# Patient Record
Sex: Female | Born: 2000 | Race: White | Hispanic: No | Marital: Single | Smoking: Never smoker
Health system: Southern US, Community
[De-identification: ages and names within clinical notes are randomized; demographics above are authoritative.]

## PROBLEM LIST (undated history)

## (undated) DIAGNOSIS — F329 Major depressive disorder, single episode, unspecified: Secondary | ICD-10-CM

## (undated) DIAGNOSIS — F419 Anxiety disorder, unspecified: Secondary | ICD-10-CM

## (undated) DIAGNOSIS — F32A Depression, unspecified: Secondary | ICD-10-CM

## (undated) DIAGNOSIS — G43909 Migraine, unspecified, not intractable, without status migrainosus: Secondary | ICD-10-CM

## (undated) HISTORY — DX: Major depressive disorder, single episode, unspecified: F32.9

## (undated) HISTORY — DX: Anxiety disorder, unspecified: F41.9

## (undated) HISTORY — DX: Depression, unspecified: F32.A

---
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
## 2019-01-05 ENCOUNTER — Ambulatory Visit: Payer: Medicaid Other | Attending: Family Medicine | Admitting: Physical Therapy

## 2019-01-05 DIAGNOSIS — M546 Pain in thoracic spine: Secondary | ICD-10-CM | POA: Insufficient documentation

## 2019-01-05 DIAGNOSIS — R293 Abnormal posture: Secondary | ICD-10-CM | POA: Insufficient documentation

## 2019-01-05 DIAGNOSIS — M62830 Muscle spasm of back: Secondary | ICD-10-CM | POA: Insufficient documentation

## 2019-01-06 ENCOUNTER — Telehealth: Payer: Self-pay | Admitting: Physical Therapy

## 2019-01-06 NOTE — Telephone Encounter (Signed)
Patient's phone not accepting voicemail. Left message on mother's phone regarding no-show to appointment yesterday and left next appointment time.

## 2019-01-07 ENCOUNTER — Encounter: Payer: Self-pay | Admitting: Physical Therapy

## 2019-01-07 ENCOUNTER — Ambulatory Visit: Payer: Medicaid Other | Admitting: Physical Therapy

## 2019-01-07 DIAGNOSIS — M62830 Muscle spasm of back: Secondary | ICD-10-CM | POA: Diagnosis not present

## 2019-01-07 DIAGNOSIS — R293 Abnormal posture: Secondary | ICD-10-CM

## 2019-01-07 DIAGNOSIS — M546 Pain in thoracic spine: Secondary | ICD-10-CM | POA: Diagnosis not present

## 2019-01-07 NOTE — Therapy (Addendum)
[place], [place], [address] Phone: [phone]   Fax:  [phone]  Physical Therapy Treatment / discharge summary  Patient Details  Name: Madeline Freeman MRN: [id number] Date of Birth: [birth year] Referring Provider (PT): Eunice Blase, MD   Encounter Date: 01/07/2019  PT End of Session - 01/07/19 1319    Visit Number  2    Number of Visits  13    Date for PT Re-Evaluation  02/09/19    Authorization Type  MCD    PT Start Time  1147    PT Stop Time  1228    PT Time Calculation (min)  41 min    Activity Tolerance  Patient tolerated treatment well    Behavior During Therapy  [facility] for tasks assessed/performed       Past Medical History:  Diagnosis Date  . Anxiety   . Depression     Past Surgical History:  Procedure Laterality Date  . WISDOM TOOTH EXTRACTION  2018    There were no vitals filed for this visit.  Subjective Assessment - 01/07/19 1150    Subjective  Pt reports her mom has been massging her back and her pain is getting better.     Currently in Pain?  No/denies                       [facility] Adult PT Treatment/Exercise - 01/07/19 0001      Lumbar Exercises: Stretches   Other Lumbar Stretch Exercise  Child's pose x2 with laterals      Lumbar Exercises: Seated   Other Seated Lumbar Exercises  Horizontal abduction 2x10; diagonals 2x10; marching; ER 2x10   On Physioball with red band; cues for form/shoulder depress     Lumbar Exercises: Supine   Other Supine Lumbar Exercises  Scap retractions on foam roller x20; horizontal abduction 2x10; flexion 2x10; forward and backward shoulder rolls x20 each;              PT Education - 01/07/19 1318    Education Details  HEP    Person(s) Educated  Patient    Methods  Explanation;Demonstration;Verbal cues;Handout    Comprehension  Verbalized understanding;Returned demonstration       PT Short Term Goals - 12/29/18 1430       PT SHORT TERM GOAL #1   Title  pt to be I with inital HEP    Time  3    Period  Weeks    Status  New    Target Date  01/19/19        PT Long Term Goals - 12/29/18 1430      PT LONG TERM GOAL #1   Title  pt to increase thoracic mobility with [facility] with no report of pain during activity     Baseline  L sidebending 8 degrees, ERP with extension    Time  6    Period  Weeks    Status  New    Target Date  02/09/19      PT LONG TERM GOAL #2   Title  decrease bil paraspinals spasm to promote trunk mobility and decrease pain to </=1/10    Baseline  significant spasm noted in bil thoracolumbar paraspinals R>L    Time  6    Period  Weeks    Status  New    Target Date  02/09/19      PT LONG TERM GOAL #3   Title  pt to verbalize and demonstrate proper posture and lifting mechanics to reduce and prevent  back pain     Baseline  no previous knowlede of proper posture    Time  6    Period  Weeks    Status  New    Target Date  02/09/19      PT LONG TERM GOAL #4   Title  pt to be I with all HEP given as of last visit to maintain and progress current level of function     Baseline  no previous HEP    Time  6    Period  Weeks    Status  New    Target Date  02/09/19            Plan - 01/07/19 1320    Clinical Impression Statement  Pt presents with no pain today and reports her mom used to be a massage therapist, so she has been rubbing her back. Performed foam roller exercises and pt reported some mild low back pain after, but felt less tense in her shoulders. Performed shoulder ther ex with red band on Senseney physioball to facilitate core stability along with thoracic strength. Updated HEP with TB exericses. Pt reported feeling better after tx.     PT Next Visit Plan  review, STW along paraspinals, discuss potential DN, posture education, cont core strengthening, modalities PRN;    PT Home Exercise Plan  book opening, childs pose, anterior pelvic titl, sitting/ standing posture;  red band with horizontal abd, rows, diagonals     Consulted and Agree with Plan of Care  Patient       Patient will benefit from skilled therapeutic intervention in order to improve the following deficits and impairments:  Pain, Increased muscle spasms, Improper body mechanics, Postural dysfunction, Decreased balance, Decreased endurance, Decreased range of motion  Visit Diagnosis: Pain in thoracic spine  Muscle spasm of back  Abnormal posture     Problem List Patient Active Problem List   Diagnosis Date Noted  . Depo-Provera contraceptive status 11/30/2018  . Scoliosis 11/30/2018  . Dysmenorrhea 11/12/2013    Fuller Mandril, SPTA 01/07/2019, 1:33 PM  [facility] [address] [place], [place], [address] Phone: [phone]   Fax:  [phone]  Name: Madeline Freeman MRN: [id number] Date of Birth: 2001/06/28         PHYSICAL THERAPY DISCHARGE SUMMARY  Visits from Start of Care: 2  Current functional level related to goals / functional outcomes: See goals   Remaining deficits: unknown   Education / Equipment: HEP,theraband, posture  Plan: Patient agrees to discharge.  Patient goals were not met. Patient is being discharged due to not returning since the last visit.  ?????     Kristoffer Leamon PT, DPT, LAT, ATC  02/09/19  1:29 PM

## 2019-01-12 ENCOUNTER — Ambulatory Visit: Payer: Medicaid Other | Admitting: Physical Therapy

## 2019-01-13 ENCOUNTER — Telehealth: Payer: Self-pay | Admitting: Physical Therapy

## 2019-01-13 NOTE — Telephone Encounter (Signed)
Attempted to call patient regarding no- show to appointment. Unable to leave voicemail.

## 2019-01-14 ENCOUNTER — Telehealth: Payer: Self-pay | Admitting: Physical Therapy

## 2019-01-14 ENCOUNTER — Ambulatory Visit: Payer: Medicaid Other | Admitting: Physical Therapy

## 2019-01-14 NOTE — Telephone Encounter (Signed)
Phone number for patient and mother not working. Unable to leave message regarding no show to appointment today.

## 2019-01-19 ENCOUNTER — Encounter: Payer: Self-pay | Admitting: Physical Therapy

## 2019-01-21 ENCOUNTER — Encounter: Payer: Self-pay | Admitting: Physical Therapy

## 2019-01-26 ENCOUNTER — Encounter: Payer: Self-pay | Admitting: Physical Therapy

## 2019-01-27 ENCOUNTER — Other Ambulatory Visit (HOSPITAL_COMMUNITY)
Admission: RE | Admit: 2019-01-27 | Discharge: 2019-01-27 | Disposition: A | Discharge status: Home / Self Care | Payer: Medicaid Other | Source: Ambulatory Visit | Attending: Nurse Practitioner | Admitting: Nurse Practitioner

## 2019-01-27 ENCOUNTER — Encounter: Payer: Self-pay | Admitting: Nurse Practitioner

## 2019-01-27 ENCOUNTER — Ambulatory Visit (INDEPENDENT_AMBULATORY_CARE_PROVIDER_SITE_OTHER): Payer: Medicaid Other | Admitting: Nurse Practitioner

## 2019-01-27 VITALS — BP 133/82 | HR 108 | Wt 124.4 lb

## 2019-01-27 DIAGNOSIS — N390 Urinary tract infection, site not specified: Secondary | ICD-10-CM | POA: Insufficient documentation

## 2019-01-27 DIAGNOSIS — N898 Other specified noninflammatory disorders of vagina: Secondary | ICD-10-CM | POA: Diagnosis not present

## 2019-01-27 DIAGNOSIS — N3001 Acute cystitis with hematuria: Secondary | ICD-10-CM

## 2019-01-27 DIAGNOSIS — R300 Dysuria: Secondary | ICD-10-CM | POA: Diagnosis not present

## 2019-01-27 LAB — POCT URINALYSIS DIPSTICK
BILIRUBIN UA: NEGATIVE
Glucose, UA: NEGATIVE
Ketones, UA: POSITIVE
Nitrite, UA: POSITIVE
PH UA: 7 (ref 5.0–8.0)
PROTEIN UA: POSITIVE — AB
Spec Grav, UA: 1.01 (ref 1.010–1.025)
Urobilinogen, UA: 0.2 E.U./dL

## 2019-01-27 MED ORDER — SULFAMETHOXAZOLE-TRIMETHOPRIM 800-160 MG PO TABS: 1.0000 | ORAL_TABLET | Freq: Two times a day (BID) | ORAL | 0 refills | Status: AC

## 2019-01-27 NOTE — Progress Notes (Signed)
   GYNECOLOGY OFFICE VISIT NOTE   History:  18 y.o. G0P0000 here today for dysuria and blood in her urine. She denies any abnormal vaginal bleeding, pelvic pain or other concerns.  She has had some vaginal discharge and wonders if it is a yeast infection or BV.  She has had symptoms for 3 days and has been taking Azo.  Did not realize that Azo would not clear the UTI infection.  Has a new sex partner and desires STD testing.  States her insurance covered the STD testing.  Advised that BV and yeast might not be covered depending on the type of Medicaid.  Client wants that testing done.  No fever or chills.  Past Medical History:  Diagnosis Date  . Anxiety   . Depression     Past Surgical History:  Procedure Laterality Date  . WISDOM TOOTH EXTRACTION  2018    The following portions of the patient's history were reviewed and updated as appropriate: allergies, current medications, past family history, past medical history, past social history, past surgical history and problem list.     Review of Systems:  Pertinent items noted in HPI and remainder of comprehensive ROS otherwise negative.  Objective:  Physical Exam BP 133/82   Pulse (!) 108   Wt 124 lb 6.4 oz (56.4 kg)   BMI 24.30 kg/m  CONSTITUTIONAL: Well-developed, well-nourished female in no acute distress.  HENT:  Normocephalic, atraumatic. External right and left ear normal.  EYES: Conjunctivae and EOM are normal. Pupils are equal, round.  No scleral icterus.  NECK: Normal range of motion, supple, no masses SKIN: Skin is warm and dry. No rash noted. Not diaphoretic. No erythema. No pallor. NEUROLOGIC: Alert and oriented to person, place, and time. Normal muscle tone coordination. No cranial nerve deficit noted. PSYCHIATRIC: Normal mood and affect. Normal behavior. Normal judgment and thought content. ABDOMEN: Soft, no distention noted.   PELVIC: \vaginal swab done.  No pelvic exam.  vagina red but essentially no discharge  seen at introitus. MUSCULOSKELETAL: Normal range of motion. No edema noted. No CVA tenderness.  Labs and Imaging No results found.  Assessment & Plan:  1. Vaginal discharge  - Cervicovaginal ancillary only( [facility])  2. Dysuria Clinic urinalysis shows red cellls and nitrites.  Will treat as UTI.  - POCT Urinalysis Dipstick - Urine Culture  3. Acute cystitis with hematuria Advised insurance may not cover meds.  Advised to get [facility] info and use if needed.  Informed client that urine culture is pending and it will determine if she is on the correct antibiotic.   Routine preventative health maintenance measures emphasized. Please refer to After Visit Summary for other counseling recommendations.   Return for As scheduled for Depo.   Total face-to-face time with patient: 15 minutes.  Over 50% of encounter was spent on counseling and coordination of care.  Nolene Bernheim, RN, MSN, NP-BC Nurse Practitioner, [facility] for [employer], [facility] 01/27/2019 2:21 PM

## 2019-01-27 NOTE — Progress Notes (Signed)
Pt has had dysuria and hematuria for the past 3 days. Pt stat she also has some vaginal itching and discharge. Pt does not have menstrual cycle on depo for contraception.

## 2019-01-27 NOTE — Patient Instructions (Signed)
Urinary Tract Infection, Adult A urinary tract infection (UTI) is an infection of any part of the urinary tract. The urinary tract includes:  The kidneys.  The ureters.  The bladder.  The urethra. These organs make, store, and get rid of pee (urine) in the body. What are the causes? This is caused by germs (bacteria) in your genital area. These germs grow and cause swelling (inflammation) of your urinary tract. What increases the risk? You are more likely to develop this condition if:  You have a small, thin tube (catheter) to drain pee.  You cannot control when you pee or poop (incontinence).  You are female, and: ? You use these methods to prevent pregnancy: ? A medicine that kills sperm (spermicide). ? A device that blocks sperm (diaphragm). ? You have low levels of a female hormone (estrogen). ? You are pregnant.  You have genes that add to your risk.  You are sexually active.  You take antibiotic medicines.  You have trouble peeing because of: ? A prostate that is bigger than normal, if you are female. ? A blockage in the part of your body that drains pee from the bladder (urethra). ? A kidney stone. ? A nerve condition that affects your bladder (neurogenic bladder). ? Not getting enough to drink. ? Not peeing often enough.  You have other conditions, such as: ? Diabetes. ? A weak disease-fighting system (immune system). ? Sickle cell disease. ? Gout. ? Injury of the spine. What are the signs or symptoms? Symptoms of this condition include:  Needing to pee right away (urgently).  Peeing often.  Peeing small amounts often.  Pain or burning when peeing.  Blood in the pee.  Pee that smells bad or not like normal.  Trouble peeing.  Pee that is cloudy.  Fluid coming from the vagina, if you are female.  Pain in the belly or lower back. Other symptoms include:  Throwing up (vomiting).  No urge to eat.  Feeling mixed up (confused).  Being tired  and grouchy (irritable).  A fever.  Watery poop (diarrhea). How is this treated? This condition may be treated with:  Antibiotic medicine.  Other medicines.  Drinking enough water. Follow these instructions at home:  Medicines  Take over-the-counter and prescription medicines only as told by your doctor.  If you were prescribed an antibiotic medicine, take it as told by your doctor. Do not stop taking it even if you start to feel better. General instructions  Make sure you: ? Pee until your bladder is empty. ? Do not hold pee for a long time. ? Empty your bladder after sex. ? Wipe from front to back after pooping if you are a female. Use each tissue one time when you wipe.  Drink enough fluid to keep your pee pale yellow.  Keep all follow-up visits as told by your doctor. This is important. Contact a doctor if:  You do not get better after 1-2 days.  Your symptoms go away and then come back. Get help right away if:  You have very bad back pain.  You have very bad pain in your lower belly.  You have a fever.  You are sick to your stomach (nauseous).  You are throwing up. Summary  A urinary tract infection (UTI) is an infection of any part of the urinary tract.  This condition is caused by germs in your genital area.  There are many risk factors for a UTI. These include having a small, thin   tube to drain pee and not being able to control when you pee or poop.  Treatment includes antibiotic medicines for germs.  Drink enough fluid to keep your pee pale yellow. This information is not intended to replace advice given to you by your health care provider. Make sure you discuss any questions you have with your health care provider. Document Released: 05/06/2008 Document Revised: 05/28/2018 Document Reviewed: 05/28/2018 Elsevier Interactive Patient Education  2019 Elsevier Inc.  

## 2019-01-28 ENCOUNTER — Encounter: Payer: Self-pay | Admitting: Physical Therapy

## 2019-01-29 LAB — CERVICOVAGINAL ANCILLARY ONLY
Bacterial vaginitis: NEGATIVE
Candida vaginitis: POSITIVE — AB
Chlamydia: POSITIVE — AB
Neisseria Gonorrhea: NEGATIVE
Trichomonas: NEGATIVE

## 2019-01-29 LAB — URINE CULTURE

## 2019-02-01 ENCOUNTER — Ambulatory Visit (HOSPITAL_COMMUNITY): Payer: Medicaid Other | Admitting: Licensed Clinical Social Worker

## 2019-02-03 ENCOUNTER — Ambulatory Visit (INDEPENDENT_AMBULATORY_CARE_PROVIDER_SITE_OTHER): Payer: Medicaid Other

## 2019-02-03 ENCOUNTER — Other Ambulatory Visit: Payer: Self-pay

## 2019-02-03 ENCOUNTER — Other Ambulatory Visit: Payer: Self-pay | Admitting: Obstetrics

## 2019-02-03 DIAGNOSIS — Z3042 Encounter for surveillance of injectable contraceptive: Secondary | ICD-10-CM

## 2019-02-03 MED ORDER — FLUCONAZOLE 150 MG PO TABS: 150.0000 mg | ORAL_TABLET | Freq: Once | ORAL | 0 refills | Status: DC

## 2019-02-03 MED ORDER — MEDROXYPROGESTERONE ACETATE 150 MG/ML IM SUSP: 150.0000 mg | INTRAMUSCULAR | 4 refills | Status: DC

## 2019-02-03 MED ORDER — AZITHROMYCIN 250 MG PO TABS: 1000.0000 mg | ORAL_TABLET | Freq: Once | ORAL | 0 refills | Status: DC

## 2019-02-03 NOTE — Progress Notes (Signed)
Lab results show UTI is treated by Bactrim prescribed at the office visit.  Additionally, has chlamydia and yeast.  Medication sent to her pharmacy and message sent to clinical staff to inform client.  Nolene Bernheim, RN, MSN, NP-BC Nurse Practitioner, [facility] for [employer], [facility] 02/03/2019 8:33 AM

## 2019-02-03 NOTE — Progress Notes (Signed)
Agree with A & P. 

## 2019-02-03 NOTE — Addendum Note (Signed)
Addended by: Currie Paris on: 02/03/2019 08:33 AM   Modules accepted: Orders

## 2019-02-03 NOTE — Progress Notes (Signed)
Patient is in the office for depo, administered in [facility] and pt tolerated well. Madeline Freeman.Next depo due 5/20-6/3 Administrations This Visit    medroxyPROGESTERone (DEPO-PROVERA) injection 150 mg    Admin Date 02/03/2019 Action Given Dose 150 mg Route Intramuscular Administered By Katrina Stack, RN

## 2019-02-03 NOTE — Progress Notes (Signed)
Depo Rx was not sent to pt pharm  Pt has Depo appt today at 1 pm Rx sent.

## 2019-03-04 ENCOUNTER — Other Ambulatory Visit (HOSPITAL_COMMUNITY)
Admission: RE | Admit: 2019-03-04 | Discharge: 2019-03-04 | Disposition: A | Discharge status: Home / Self Care | Payer: Medicaid Other | Source: Ambulatory Visit | Attending: Obstetrics and Gynecology | Admitting: Obstetrics and Gynecology

## 2019-03-04 ENCOUNTER — Ambulatory Visit: Payer: Medicaid Other

## 2019-03-04 DIAGNOSIS — N898 Other specified noninflammatory disorders of vagina: Secondary | ICD-10-CM

## 2019-03-04 DIAGNOSIS — N3001 Acute cystitis with hematuria: Secondary | ICD-10-CM | POA: Diagnosis not present

## 2019-03-04 NOTE — Progress Notes (Signed)
I have reviewed this chart and agree with the RN/CMA assessment and management.    K. Meryl Kennethia Lynes, M.D. Attending [facility]   

## 2019-03-04 NOTE — Progress Notes (Signed)
Pt is in office for TOC for Chlamydia and UTI.

## 2019-03-05 ENCOUNTER — Other Ambulatory Visit: Payer: Self-pay

## 2019-03-05 ENCOUNTER — Ambulatory Visit (HOSPITAL_COMMUNITY): Payer: Medicaid Other | Admitting: Psychiatry

## 2019-03-06 ENCOUNTER — Other Ambulatory Visit: Payer: Self-pay | Admitting: Obstetrics and Gynecology

## 2019-03-06 LAB — URINE CULTURE

## 2019-03-06 MED ORDER — CEPHALEXIN 500 MG PO CAPS: 500.0000 mg | ORAL_CAPSULE | Freq: Four times a day (QID) | ORAL | 0 refills | Status: AC

## 2019-03-06 NOTE — Progress Notes (Signed)
Keflex sent to pharmacy for symptomatic UTI

## 2019-03-09 LAB — CERVICOVAGINAL ANCILLARY ONLY
Chlamydia: NEGATIVE
Neisseria Gonorrhea: NEGATIVE

## 2019-03-12 ENCOUNTER — Other Ambulatory Visit (HOSPITAL_COMMUNITY): Payer: Self-pay | Admitting: Psychiatry

## 2019-03-12 DIAGNOSIS — F331 Major depressive disorder, recurrent, moderate: Secondary | ICD-10-CM

## 2019-03-19 ENCOUNTER — Other Ambulatory Visit (HOSPITAL_COMMUNITY): Payer: Self-pay | Admitting: Psychiatry

## 2019-03-19 DIAGNOSIS — F331 Major depressive disorder, recurrent, moderate: Secondary | ICD-10-CM

## 2019-03-19 MED ORDER — LAMOTRIGINE 150 MG PO TABS: 150.0000 mg | ORAL_TABLET | Freq: Every day | ORAL | 0 refills | Status: DC

## 2019-04-14 ENCOUNTER — Other Ambulatory Visit: Payer: Self-pay | Admitting: *Deleted

## 2019-04-14 MED ORDER — FLUCONAZOLE 150 MG PO TABS: 150.0000 mg | ORAL_TABLET | Freq: Once | ORAL | 0 refills | Status: DC

## 2019-04-14 MED ORDER — AZITHROMYCIN 250 MG PO TABS: 1000.0000 mg | ORAL_TABLET | Freq: Once | ORAL | 0 refills | Status: AC

## 2019-04-14 NOTE — Progress Notes (Signed)
Pt called to office with similar symptoms of chlamydia exposure.  Pt states that partner hasn't been treated and they had intercourse with break in condom. Pt made aware that tx can be sent today and also sent Diflucan for vaginal itching. Pt advised to have partner be seen at [facility] or Urgent Care for tx.  Pt advised to call office with any problems.

## 2019-04-28 ENCOUNTER — Ambulatory Visit (INDEPENDENT_AMBULATORY_CARE_PROVIDER_SITE_OTHER): Payer: Medicaid Other

## 2019-04-28 ENCOUNTER — Other Ambulatory Visit: Payer: Self-pay

## 2019-04-28 DIAGNOSIS — Z3042 Encounter for surveillance of injectable contraceptive: Secondary | ICD-10-CM

## 2019-04-28 NOTE — Progress Notes (Signed)
Nurse visit for Depo Pt is on time for injection Next Depo due Aug 12-26, pt agrees

## 2019-04-28 NOTE — Progress Notes (Signed)
I have reviewed the chart and agree with nursing staff's documentation of this patient's encounter.  Catalina Antigua, MD 04/28/2019 4:15 PM

## 2019-04-29 ENCOUNTER — Encounter (HOSPITAL_COMMUNITY): Payer: Self-pay | Admitting: Psychiatry

## 2019-04-29 ENCOUNTER — Ambulatory Visit (INDEPENDENT_AMBULATORY_CARE_PROVIDER_SITE_OTHER): Payer: Medicaid Other | Admitting: Psychiatry

## 2019-04-29 DIAGNOSIS — F331 Major depressive disorder, recurrent, moderate: Secondary | ICD-10-CM | POA: Diagnosis not present

## 2019-04-29 DIAGNOSIS — F411 Generalized anxiety disorder: Secondary | ICD-10-CM | POA: Diagnosis not present

## 2019-04-29 MED ORDER — LAMOTRIGINE 150 MG PO TABS: 150.0000 mg | ORAL_TABLET | Freq: Every day | ORAL | 1 refills | Status: DC

## 2019-04-29 MED ORDER — VENLAFAXINE HCL ER 37.5 MG PO CP24: ORAL_CAPSULE | ORAL | 1 refills | Status: DC

## 2019-04-29 NOTE — Progress Notes (Signed)
Virtual Visit via Telephone Note  I connected with Madeline Freeman on 04/29/19 at 11:00 AM EDT by telephone and verified that I am speaking with the correct person using two identifiers.   I discussed the limitations, risks, security and privacy concerns of performing an evaluation and management service by telephone and the availability of in person appointments. I also discussed with the patient that there may be a patient responsible charge related to this service. The patient expressed understanding and agreed to proceed.   History of Present Illness: Patient was evaluated by phone session.  She is experiencing increased anxiety, nervousness and having crying spells and irritability.  She is not sure what triggered but admitted stress at work is increased.  She works at [employer] and lately she admitted sometimes getting short with the patient.  She is also sleeping on and off.  She endorsed her anxiety is increased.  She admitted some time more isolated but denies any active or passive suicidal thoughts.  She apologized missing appointment.  She also not getting therapy from [facility] due to busy schedule.  However she like to resume therapy.  She did online classes until semester finished.  Patient wants to do nursing.  Her energy level is fair.  Her appetite is fair.  She does not feel hydroxyzine working despite taking every day recently.  She reported Lamictal helps her overall mood and denies any rash, itching, tremors or shakes.  She denies drinking or using any illegal substances.  Recently she has seen physician for UTI and finished antibiotic.  She is feeling better.    Past Psychiatric History: Reviewed. No history of psychiatric inpatient treatment or any suicidal attempt.  Seen therapist in the past when mother divorced her stepfather.  Recent Results (from the past 2160 hour(s))  Cervicovaginal ancillary only( [place])     Status: None   Collection Time: 03/04/19 12:00 AM  Result Value  Ref Range   Chlamydia Negative     Comment: Normal Reference Range - Negative   Neisseria gonorrhea Negative     Comment: Normal Reference Range - Negative  Urine Culture     Status: Abnormal   Collection Time: 03/04/19  1:23 AM  Result Value Ref Range   Urine Culture, Routine Final report (A)    Organism ID, Bacteria Comment (A)     Comment: Beta hemolytic Streptococcus, group B 25,000-50,000 colony forming units per mL Penicillin and ampicillin are drugs of choice for treatment of beta-hemolytic streptococcal infections. Susceptibility testing of penicillins and other beta-lactam agents approved by the FDA for treatment of beta-hemolytic streptococcal infections need not be performed routinely because nonsusceptible isolates are extremely rare in any beta-hemolytic streptococcus and have not been reported for Streptococcus pyogenes (group A). (CLSI)      Psychiatric Specialty Exam: Physical Exam  ROS  There were no vitals taken for this visit.There is no height or weight on file to calculate BMI.  General Appearance: NA  Eye Contact:  NA  Speech:  Clear and Coherent  Volume:  Normal  Mood:  Anxious and Dysphoric  Affect:  NA  Thought Process:  Goal Directed  Orientation:  Full (Time, Place, and Person)  Thought Content:  Rumination  Suicidal Thoughts:  No  Homicidal Thoughts:  No  Memory:  Immediate;   Good Recent;   Good Remote;   Good  Judgement:  Fair  Insight:  Good  Psychomotor Activity:  NA  Concentration:  Concentration: Good and Attention Span: Good  Recall:  Good  Fund of Knowledge:  Good  Language:  Good  Akathisia:  No  Handed:  Right  AIMS (if indicated):     Assets:  Communication Skills Desire for Improvement Housing Resilience Social Support Transportation  ADL's:  Intact  Cognition:  WNL  Sleep:   20      Assessment and Plan: Major depressive disorder, recurrent.  Generalized anxiety disorder.  I reviewed her blood work results,  records from other providers.  She is experiencing increased anxiety and irritability.  She does not want to change because she felt it is helping her mood would like to try something else to help her anxiety and depression.  I will discontinue hydroxyzine and try Effexor 37.5 mg daily for 1 week and then 75 mg daily.  Discussed medication side effect specially if she ever decided to come off from the medication then she need to go slowly due to potential withdrawal symptoms.  I will continue Lamictal 150 mg daily.  I also encouraged to restart therapy with Shanda Bumps for CBT.  Discussed safety concern that anytime having active suicidal thoughts or homicidal thought then she need to call 911 or go to local emergency room.  Follow-up in 4 weeks.  Follow Up Instructions:    I discussed the assessment and treatment plan with the patient. The patient was provided an opportunity to ask questions and all were answered. The patient agreed with the plan and demonstrated an understanding of the instructions.   The patient was advised to call back or seek an in-person evaluation if the symptoms worsen or if the condition fails to improve as anticipated.  I provided 25 minutes of non-face-to-face time during this encounter.   Cleotis Nipper, MD

## 2019-05-27 ENCOUNTER — Encounter (HOSPITAL_COMMUNITY): Payer: Self-pay | Admitting: Psychiatry

## 2019-05-27 ENCOUNTER — Other Ambulatory Visit: Payer: Self-pay

## 2019-05-27 ENCOUNTER — Ambulatory Visit (INDEPENDENT_AMBULATORY_CARE_PROVIDER_SITE_OTHER): Payer: Medicaid Other | Admitting: Psychiatry

## 2019-05-27 DIAGNOSIS — F331 Major depressive disorder, recurrent, moderate: Secondary | ICD-10-CM

## 2019-05-27 DIAGNOSIS — F411 Generalized anxiety disorder: Secondary | ICD-10-CM

## 2019-05-27 MED ORDER — LAMOTRIGINE 150 MG PO TABS: 150.0000 mg | ORAL_TABLET | Freq: Every day | ORAL | 2 refills | Status: DC

## 2019-05-27 MED ORDER — VENLAFAXINE HCL ER 75 MG PO CP24: 75.0000 mg | ORAL_CAPSULE | Freq: Every day | ORAL | 2 refills | Status: DC

## 2019-05-27 NOTE — Progress Notes (Signed)
Virtual Visit via Telephone Note  I connected with Madeline Freeman on 05/27/19 at 11:20 AM EDT by telephone and verified that I am speaking with the correct person using two identifiers.   I discussed the limitations, risks, security and privacy concerns of performing an evaluation and management service by telephone and the availability of in person appointments. I also discussed with the patient that there may be a patient responsible charge related to this service. The patient expressed understanding and agreed to proceed.   History of Present Illness: Patient was evaluated by phone session.  On her last visit we started on Effexor.  She is taking Effexor 37.5 mg twice a day.  She admitted much improvement in her anxiety, crying spells and irritability.  Her sleep is improved.  She is not anxious as she used to.  She works at [employer] and she reported that she can handle her job much better since started the Effexor.  She also takes Lamictal which is helping her mood.  She has no rash, itching, shakes.  She apologized not able to resume therapy with Janett Billow but like to set up an appointment.  She denies any tremors, shakes or any EPS.  She denies any agitation or anger.  Her energy level is improved.  She finished her online semester and now she will resume in August.  She is no longer taking hydroxyzine.  She rarely takes muscle relaxant when she need for her back pain.  She denies drinking or using any illegal substances.  Past Psychiatric History:Reviewed. No history of psychiatric inpatient treatment or any suicidal attempt. Seen therapist in the past when mother divorced her stepfather.  We tried hydroxyzine for anxiety that did not help.   Psychiatric Specialty Exam: Physical Exam  ROS  There were no vitals taken for this visit.There is no height or weight on file to calculate BMI.  General Appearance: NA  Eye Contact:  NA  Speech:  Clear and Coherent and Slow  Volume:  Normal  Mood:   Euthymic  Affect:  NA  Thought Process:  Goal Directed  Orientation:  Full (Time, Place, and Person)  Thought Content:  WDL  Suicidal Thoughts:  No  Homicidal Thoughts:  No  Memory:  Immediate;   Good Recent;   Good Remote;   Good  Judgement:  Good  Insight:  Good  Psychomotor Activity:  NA  Concentration:  Concentration: Good and Attention Span: Good  Recall:  Good  Fund of Knowledge:  Good  Language:  Good  Akathisia:  No  Handed:  Right  AIMS (if indicated):     Assets:  Communication Skills Desire for Improvement Housing Resilience  ADL's:  Intact  Cognition:  WNL  Sleep:         Assessment and Plan: Major depressive disorder, recurrent.  Generalized anxiety disorder.  Patient doing better on her current medication.  I will continue Effexor 75 mg daily and Lamictal 150 mg daily.  Recommended to call us back if she has any question or any concern.  We will schedule appointment with Janett Billow for therapy.  Recommended to call us back if she has any question or any concern.  Follow-up in 3 months.  Follow Up Instructions:    I discussed the assessment and treatment plan with the patient. The patient was provided an opportunity to ask questions and all were answered. The patient agreed with the plan and demonstrated an understanding of the instructions.   The patient was advised to call  back or seek an in-person evaluation if the symptoms worsen or if the condition fails to improve as anticipated.  I provided 15 minutes of non-face-to-face time during this encounter.   Kathlee Nations, MD

## 2019-07-07 ENCOUNTER — Ambulatory Visit (INDEPENDENT_AMBULATORY_CARE_PROVIDER_SITE_OTHER): Payer: Medicaid Other

## 2019-07-07 ENCOUNTER — Other Ambulatory Visit: Payer: Self-pay

## 2019-07-07 ENCOUNTER — Other Ambulatory Visit (HOSPITAL_COMMUNITY)
Admission: RE | Admit: 2019-07-07 | Discharge: 2019-07-07 | Disposition: A | Discharge status: Home / Self Care | Payer: Medicaid Other | Source: Ambulatory Visit | Attending: Obstetrics | Admitting: Obstetrics

## 2019-07-07 DIAGNOSIS — Z113 Encounter for screening for infections with a predominantly sexual mode of transmission: Secondary | ICD-10-CM | POA: Insufficient documentation

## 2019-07-07 DIAGNOSIS — N898 Other specified noninflammatory disorders of vagina: Secondary | ICD-10-CM | POA: Diagnosis not present

## 2019-07-07 DIAGNOSIS — R300 Dysuria: Secondary | ICD-10-CM | POA: Diagnosis not present

## 2019-07-07 NOTE — Progress Notes (Signed)
SUBJECTIVE:  18 y.o. female complains of vaginitis and dysuria x few days.  Denies abnormal vaginal bleeding or significant pelvic pain or Fever. UTI symptoms. Has history of known exposure to STD.  OBJECTIVE:  She appears well, afebrile. Urine dipstick: not done.  ASSESSMENT:  Vaginal Discharge   PLAN:  GC, chlamydia, trichomonas, BVAG, CVAG probe sent to lab. Treatment: To be determined once lab results are received ROV prn if symptoms persist or worsen.

## 2019-07-07 NOTE — Progress Notes (Signed)
Patient ID: Madeline Freeman, female   DOB: [birth year], 18 y.o.   MRN: [id number] Patient seen and assessed by nursing staff during this encounter. I have reviewed the chart and agree with the documentation and plan.  Emeterio Reeve, MD 07/07/2019 1:28 PM

## 2019-07-09 LAB — URINE CULTURE

## 2019-07-12 ENCOUNTER — Telehealth: Payer: Self-pay

## 2019-07-12 LAB — CERVICOVAGINAL ANCILLARY ONLY
Bacterial vaginitis: POSITIVE — AB
Candida vaginitis: NEGATIVE
Chlamydia: POSITIVE — AB
Neisseria Gonorrhea: NEGATIVE
Trichomonas: NEGATIVE

## 2019-07-12 NOTE — Telephone Encounter (Signed)
Returned call and advised that results are not back yet.

## 2019-07-14 ENCOUNTER — Other Ambulatory Visit: Payer: Self-pay | Admitting: Obstetrics & Gynecology

## 2019-07-14 DIAGNOSIS — B9689 Other specified bacterial agents as the cause of diseases classified elsewhere: Secondary | ICD-10-CM

## 2019-07-14 DIAGNOSIS — A749 Chlamydial infection, unspecified: Secondary | ICD-10-CM

## 2019-07-14 DIAGNOSIS — N760 Acute vaginitis: Secondary | ICD-10-CM

## 2019-07-14 MED ORDER — AZITHROMYCIN 500 MG PO TABS: 1000.0000 mg | ORAL_TABLET | Freq: Once | ORAL | 1 refills | Status: AC

## 2019-07-14 MED ORDER — METRONIDAZOLE 500 MG PO TABS: 500.0000 mg | ORAL_TABLET | Freq: Two times a day (BID) | ORAL | 0 refills | Status: DC

## 2019-07-14 NOTE — Progress Notes (Incomplete)
azithr

## 2019-07-21 ENCOUNTER — Other Ambulatory Visit: Payer: Self-pay

## 2019-07-21 ENCOUNTER — Ambulatory Visit (INDEPENDENT_AMBULATORY_CARE_PROVIDER_SITE_OTHER): Payer: Medicaid Other

## 2019-07-21 DIAGNOSIS — Z3042 Encounter for surveillance of injectable contraceptive: Secondary | ICD-10-CM

## 2019-07-21 MED ORDER — MEDROXYPROGESTERONE ACETATE 150 MG/ML IM SUSP
150.0000 mg | Freq: Once | INTRAMUSCULAR | Status: AC
  Administered 2019-07-21: 150 mg via INTRAMUSCULAR

## 2019-07-21 NOTE — Progress Notes (Signed)
Pt is here for depo, she is on time for injection. Injection given in [facility] without difficulty. Next depo due 11/4-11/18.

## 2019-08-26 ENCOUNTER — Encounter (HOSPITAL_COMMUNITY): Payer: Self-pay | Admitting: Psychiatry

## 2019-08-26 ENCOUNTER — Ambulatory Visit (INDEPENDENT_AMBULATORY_CARE_PROVIDER_SITE_OTHER): Payer: Medicaid Other | Admitting: Psychiatry

## 2019-08-26 ENCOUNTER — Other Ambulatory Visit: Payer: Self-pay

## 2019-08-26 DIAGNOSIS — F411 Generalized anxiety disorder: Secondary | ICD-10-CM | POA: Diagnosis not present

## 2019-08-26 DIAGNOSIS — F331 Major depressive disorder, recurrent, moderate: Secondary | ICD-10-CM | POA: Diagnosis not present

## 2019-08-26 MED ORDER — SERTRALINE HCL 50 MG PO TABS: 50.0000 mg | ORAL_TABLET | Freq: Every day | ORAL | 1 refills | Status: DC

## 2019-08-26 MED ORDER — LAMOTRIGINE 150 MG PO TABS: 150.0000 mg | ORAL_TABLET | Freq: Every day | ORAL | 1 refills | Status: DC

## 2019-08-26 NOTE — Progress Notes (Signed)
Virtual Visit via Telephone Note  I connected with Madeline Freeman on 08/26/19 at  1:20 PM EDT by telephone and verified that I am speaking with the correct person using two identifiers.   I discussed the limitations, risks, security and privacy concerns of performing an evaluation and management service by telephone and the availability of in person appointments. I also discussed with the patient that there may be a patient responsible charge related to this service. The patient expressed understanding and agreed to proceed.   History of Present Illness: Patient was evaluated by phone session.  She is complaining of irritability, increased anxiety and sleeping too much with the Effexor.  She is been taking the Paxil 3 months however last 6 weeks she noticed that she has been either very tired already irritable.  She is sleeping 10 to 12 hours a day.  She like to try a different medication for anxiety.  Despite recommended to see Janett Billow for therapy she again forgot to schedule appointment.  She denies any paranoia, hallucination, suicidal thoughts.  She is taking Lamictal which she believes helped most of the time but adding Effexor causing more issues.  She works at [employer] and she reported her job is going well.  She resume online school in August that may also contributed to her anxiety.  She denies drinking or using any illegal substances.  Her appetite is okay.  Her weight is a stable.   Past Psychiatric History:Reviewed. No history of psychiatric inpatient treatment or any suicidal attempt. Seen therapist in the past when mother divorced her stepfather.  We tried hydroxyzine for anxiety and Effexor that did not help.    Psychiatric Specialty Exam: Physical Exam  ROS  There were no vitals taken for this visit.There is no height or weight on file to calculate BMI.  General Appearance: NA  Eye Contact:  NA  Speech:  Clear and Coherent  Volume:  Normal  Mood:  Anxious and Irritable  Affect:  NA   Thought Process:  Goal Directed  Orientation:  Full (Time, Place, and Person)  Thought Content:  Rumination  Suicidal Thoughts:  No  Homicidal Thoughts:  No  Memory:  Immediate;   Good Recent;   Good Remote;   Good  Judgement:  Fair  Insight:  Fair  Psychomotor Activity:  NA  Concentration:  Concentration: Fair and Attention Span: Fair  Recall:  Good  Fund of Knowledge:  Good  Language:  Good  Akathisia:  No  Handed:  Right  AIMS (if indicated):     Assets:  Communication Skills Desire for Improvement Housing Resilience Social Support  ADL's:  Intact  Cognition:  WNL  Sleep:   too much      Assessment and Plan: Major depressive disorder, recurrent.  Generalized anxiety disorder.  Patient reported her anxiety and irritability got worse with the Effexor.  She is sleeping too much.  She like to try a different medication for anxiety.  We will try Zoloft 50 mg but in the beginning she will take half tablet for 1 week.  We discussed slowly and gradually coming off from the Effexor due to withdrawal symptoms.  She will take Effexor 75 mg every other day for 1 week and then every 2 days until she is stop in 2 weeks.  She will continue Lamictal 150 mg daily.  She has no rash, itching or tremors.  One more time I encouraged to schedule appointment with Janett Billow for CBT.  She last seen Janett Billow in  January.  I recommended to call us back if she has any question or any concern.  Follow-up in 2 months.  Follow Up Instructions:    I discussed the assessment and treatment plan with the patient. The patient was provided an opportunity to ask questions and all were answered. The patient agreed with the plan and demonstrated an understanding of the instructions.   The patient was advised to call back or seek an in-person evaluation if the symptoms worsen or if the condition fails to improve as anticipated.  I provided 20 minutes of non-face-to-face time during this encounter.   Cleotis Nipper, MD

## 2019-09-13 ENCOUNTER — Ambulatory Visit (HOSPITAL_COMMUNITY): Payer: Medicaid Other | Admitting: Licensed Clinical Social Worker

## 2019-09-13 ENCOUNTER — Other Ambulatory Visit: Payer: Self-pay

## 2019-09-20 ENCOUNTER — Telehealth (HOSPITAL_COMMUNITY): Payer: Self-pay | Admitting: Licensed Clinical Social Worker

## 2019-09-20 ENCOUNTER — Other Ambulatory Visit: Payer: Self-pay

## 2019-09-20 ENCOUNTER — Encounter: Payer: Self-pay | Admitting: Advanced Practice Midwife

## 2019-09-20 ENCOUNTER — Other Ambulatory Visit (HOSPITAL_COMMUNITY)
Admission: RE | Admit: 2019-09-20 | Discharge: 2019-09-20 | Disposition: A | Discharge status: Home / Self Care | Payer: Medicaid Other | Source: Ambulatory Visit | Attending: Advanced Practice Midwife | Admitting: Advanced Practice Midwife

## 2019-09-20 ENCOUNTER — Ambulatory Visit (HOSPITAL_COMMUNITY): Payer: Medicaid Other | Admitting: Licensed Clinical Social Worker

## 2019-09-20 ENCOUNTER — Ambulatory Visit (INDEPENDENT_AMBULATORY_CARE_PROVIDER_SITE_OTHER): Payer: Medicaid Other | Admitting: Advanced Practice Midwife

## 2019-09-20 VITALS — BP 127/79 | HR 82 | Wt 150.0 lb

## 2019-09-20 DIAGNOSIS — R102 Pelvic and perineal pain: Secondary | ICD-10-CM | POA: Insufficient documentation

## 2019-09-20 DIAGNOSIS — Z3009 Encounter for other general counseling and advice on contraception: Secondary | ICD-10-CM | POA: Diagnosis not present

## 2019-09-20 DIAGNOSIS — N898 Other specified noninflammatory disorders of vagina: Secondary | ICD-10-CM | POA: Diagnosis not present

## 2019-09-20 DIAGNOSIS — N941 Unspecified dyspareunia: Secondary | ICD-10-CM | POA: Diagnosis not present

## 2019-09-20 NOTE — Progress Notes (Signed)
Teen GYN patient presents for a problem visit today.  CC:pt notes pelvic pain and vaginal pain after intercourse Since 01/2019.   LMP: None with  Depo   Contraception: Depo Last given 08/21/19 Due 11/03-1117.satisfied with current contraception method.   STD Screening: Last + CT on 07/07/2019 pt notes completing treatment Rx and notes partner was treated.   *Patient consent to student being present in exam room.

## 2019-09-20 NOTE — Telephone Encounter (Signed)
Clinician opened web portal for therapy session, sent reminder 10 minutes after the session start time, sent a text about logging in, and called. No answer.  No show.

## 2019-09-20 NOTE — Progress Notes (Signed)
  GYNECOLOGY PROGRESS NOTE  History:  18 y.o. G0P0000 presents to [facility] office today for problem gyn visit. She reports pelvic pain with intercourse x 7-8 months.  She uses Depo for contraception since 2018.  She reports no problems until March of this year, when she started having issues with vaginal dryness during intercourse.  Lubricants help and reduce the pain but she does not want to continue to use these.  She never had this symptom before now.  The pain is burning/irritation pain and only occurs sometimes, and does not occur with every episode of intercourse.    The following portions of the patient's history were reviewed and updated as appropriate: allergies, current medications, past family history, past medical history, past social history, past surgical history and problem list.   Review of Systems:  Pertinent items are noted in HPI.   Objective:  Physical Exam Blood pressure 127/79, pulse 82, weight 150 lb (68 kg). VS reviewed, nursing note reviewed,  Constitutional: well developed, well nourished, no distress HEENT: normocephalic CV: normal rate Pulm/chest wall: normal effort Breast Exam: deferred Abdomen: soft Neuro: alert and oriented x 3 Skin: warm, dry Psych: affect normal Pelvic exam: external inspection: vaginal walls and external genitalia normal Bimanual exam: Cervix 0/long/high, firm, anterior, neg CMT, uterus nontender, nonenlarged, adnexa without tenderness, enlargement, or mass  Assessment & Plan:  1. Vaginal pain --Pt reports discomfort, not really pain and only with some episodes of intercourse, not every time.  2. Pelvic pain in female --Not acute, started 01/2019, is intermittent, mostly with intercourse.  Pt is not having menses due to Depo so pain is not related to menses.  --Pain is burning, irritation pain, on external and internal vaginal areas, and pt associates this with vaginal dryness --Family hx of endometriosis, but without menses, this is  not likely source of pain --Pt with hx chlamydia, she and partner were treated 07/2018, no discharge with odor currently. Will retest today. --Bimanual exam wnl.  Outpatient [place] to evaluate for other causes of pain - Cervicovaginal ancillary only( [place]) - US PELVIC COMPLETE WITH TRANSVAGINAL; Future --Follow up in 3 months with MD if symptoms persist  3. Dyspareunia in female   4. Vaginal dryness --Pt reports dryness after 10 minutes of intercourse, this is problematic for her and her partner and is new this year. --Depo since 09/2017, pt has tried OCPs and IUD and desires to continue Depo --Discussed contraception as possible cause of vaginal dryness.  Encouraged use of lubricants during intercourse for comfort.   --F/U in 3 months and consider contraceptive switch  5. Encounter for counseling regarding contraception --Discussed LARCs as most effective forms of birth control.  Discussed benefits/risks of other methods.  Pt has tried IUD and had pelvic pain and frequent bleeding.  Pt desires to continue Depo.    Fatima Blank, CNM 12:07 PM

## 2019-09-20 NOTE — Patient Instructions (Signed)
Dyspareunia, Female Dyspareunia is pain that is associated with sexual activity. This can affect any part of the genitals or lower abdomen. There are many possible causes of this condition. In some cases, diagnosing the cause of dyspareunia can be difficult. This condition can be mild, moderate, or severe. Depending on the cause, dyspareunia may get better with treatment, but may return (recur) over time. What are the causes?  The cause of this condition is not always known. However, problems that affect the vulva, vagina, uterus, and other organs may cause dyspareunia. Common causes of this condition include:  Vaginal dryness.  Giving birth.  Infection.  Skin changes or conditions.  Side effects of medicines.  Endometriosis. This is when tissue that is like the lining of the uterus grows on the outside of the uterus.  Psychological conditions. These include depression, anxiety, or traumatic experiences.  Allergic reaction. What increases the risk? The following factors may make you more likely to develop this condition:  History of physical or sexual trauma.  Some medicines.  No longer having a monthly period (menopause).  Having recently given birth.  Taking baths using soaps that have perfumes. These can cause irritation.  Douching. What are the signs or symptoms? The main symptom of this condition is pain in any part of your genitals or lower abdomen during or after sex. This may include:  Irritation, burning, or stinging sensations in your vulva.  Discomfort when your vulva or surrounding area is touched.  Aching and throbbing pain that may be constant.  Pain that gets worse when something is inserted into your vagina. How is this diagnosed? This condition may be diagnosed based on:  Your symptoms, including where and when your pain occurs.  Your medical history.  A physical exam. A pelvic exam will most likely be done.  Tests that include ultrasound,  blood tests, and tests that check the body for infection.  Imaging tests, such as X-ray, MRI, and CT scan. You may be referred to a health care provider who specializes in women's health (gynecologist). How is this treated? Treatment depends on the cause of your condition and your symptoms. In most cases, you may need to stop sexual activity until your symptoms go away or get better. Treatment may include:  Lubricants, ointments, and creams.  Physical therapy.  Massage therapy.  Hormonal therapy.  Medicines to: ? Prevent or fight infection. ? Relieve pain. ? Help numb the area. ? Treat depression (antidepressants).  Counseling, which may include sex therapy.  Surgery. Follow these instructions at home: Lifestyle  Wear cotton underwear.  Use water-based lubricants as needed during sex. Avoid oil-based lubricants.  Do not use any products that can cause irritation. This may include certain condoms, spermicides, lubricants, soaps, tampons, vaginal sprays, or douches.  Always practice safe sex. Use a condom to prevent sexually transmitted infections (STIs).  Talk freely with your partner about your condition. General instructions  Take or apply over-the-counter and prescription medicines only as told by your health care provider.  Urinate before you have sex.  Consider joining a support group.  Get the results of any tests you have done. Ask your health care provider, or the department that is doing the procedure, when your results will be ready.  Keep all follow-up visits as told by your health care provider. This is important. Contact a health care provider if:  You have vaginal bleeding after having sex.  You develop a lump at the opening of your vagina even if the   lump is painless.  You have: ? Abnormal discharge from your vagina. ? Vaginal dryness. ? Itchiness or irritation of your vulva or vagina. ? A new rash. ? Symptoms that get worse or do not improve  with treatment. ? A fever. ? Pain when you urinate. ? Blood in your urine. Get help right away if:  You have severe pain in your abdomen during or shortly after sex.  You pass out after sex. Summary  Dyspareunia is pain that is associated with sexual activity. This can affect any part of the genitals or lower abdomen.  There are many causes of this condition. Treatment depends on the cause and your symptoms. In most cases, you may need to stop sexual activity until your symptoms improve.  Take or apply over-the-counter and prescription medicines only as told by your health care provider.  Contact a health care provider if your symptoms get worse or do not improve with treatment.  Keep all follow-up visits as told by your health care provider. This is important. This information is not intended to replace advice given to you by your health care provider. Make sure you discuss any questions you have with your health care provider. Document Released: 12/08/2007 Document Revised: 01/25/2019 Document Reviewed: 01/25/2019 Elsevier Patient Education  2020 [employer]  

## 2019-09-23 LAB — CERVICOVAGINAL ANCILLARY ONLY
Bacterial Vaginitis (gardnerella): NEGATIVE
Candida Glabrata: NEGATIVE
Candida Vaginitis: NEGATIVE
Chlamydia: NEGATIVE
Comment: NEGATIVE
Comment: NEGATIVE
Comment: NEGATIVE
Comment: NEGATIVE
Comment: NEGATIVE
Comment: NORMAL
Neisseria Gonorrhea: NEGATIVE
Trichomonas: NEGATIVE

## 2019-09-28 ENCOUNTER — Other Ambulatory Visit: Payer: Self-pay

## 2019-09-28 ENCOUNTER — Ambulatory Visit (HOSPITAL_COMMUNITY)
Admission: RE | Admit: 2019-09-28 | Discharge: 2019-09-28 | Disposition: A | Discharge status: Home / Self Care | Payer: Medicaid Other | Source: Ambulatory Visit | Attending: Advanced Practice Midwife | Admitting: Advanced Practice Midwife

## 2019-09-28 DIAGNOSIS — R102 Pelvic and perineal pain: Secondary | ICD-10-CM

## 2019-10-05 ENCOUNTER — Telehealth: Payer: Self-pay | Admitting: Advanced Practice Midwife

## 2019-10-05 NOTE — Telephone Encounter (Signed)
Called pt to review normal pelvic US, left message for pt to return call to go over results/plan for follow up.

## 2019-10-14 ENCOUNTER — Ambulatory Visit: Payer: Medicaid Other

## 2019-10-19 ENCOUNTER — Ambulatory Visit: Payer: Medicaid Other

## 2019-10-20 ENCOUNTER — Other Ambulatory Visit: Payer: Self-pay

## 2019-10-20 ENCOUNTER — Other Ambulatory Visit: Payer: Self-pay | Admitting: Obstetrics

## 2019-10-20 ENCOUNTER — Ambulatory Visit (INDEPENDENT_AMBULATORY_CARE_PROVIDER_SITE_OTHER): Payer: Medicaid Other

## 2019-10-20 DIAGNOSIS — Z3042 Encounter for surveillance of injectable contraceptive: Secondary | ICD-10-CM

## 2019-10-20 MED ORDER — MEDROXYPROGESTERONE ACETATE 150 MG/ML IM SUSP: 150.0000 mg | INTRAMUSCULAR | 4 refills | Status: DC

## 2019-10-20 MED ORDER — MEDROXYPROGESTERONE ACETATE 150 MG/ML IM SUSP
150.0000 mg | Freq: Once | INTRAMUSCULAR | Status: AC
  Administered 2019-10-20: 150 mg via INTRAMUSCULAR

## 2019-10-20 NOTE — Progress Notes (Signed)
Pt is here for depo injection, she is on time. Injection given in RUOQ without difficulty. Next depo injection due 01/05/20-01/19/20, pt made aware.

## 2019-10-22 ENCOUNTER — Ambulatory Visit (HOSPITAL_COMMUNITY)
Admission: EM | Admit: 2019-10-22 | Discharge: 2019-10-22 | Disposition: A | Discharge status: Home / Self Care | Payer: Medicaid Other | Attending: Emergency Medicine | Admitting: Emergency Medicine

## 2019-10-22 ENCOUNTER — Telehealth: Payer: Medicaid Other

## 2019-10-22 ENCOUNTER — Encounter (HOSPITAL_COMMUNITY): Payer: Self-pay

## 2019-10-22 ENCOUNTER — Other Ambulatory Visit: Payer: Self-pay

## 2019-10-22 DIAGNOSIS — Z20828 Contact with and (suspected) exposure to other viral communicable diseases: Secondary | ICD-10-CM | POA: Insufficient documentation

## 2019-10-22 DIAGNOSIS — R05 Cough: Secondary | ICD-10-CM

## 2019-10-22 DIAGNOSIS — R197 Diarrhea, unspecified: Secondary | ICD-10-CM

## 2019-10-22 DIAGNOSIS — R519 Headache, unspecified: Secondary | ICD-10-CM | POA: Diagnosis not present

## 2019-10-22 DIAGNOSIS — Z20822 Contact with and (suspected) exposure to covid-19: Secondary | ICD-10-CM

## 2019-10-22 MED ORDER — CETIRIZINE-PSEUDOEPHEDRINE ER 5-120 MG PO TB12: 1.0000 | ORAL_TABLET | Freq: Every day | ORAL | 0 refills | Status: DC

## 2019-10-22 MED ORDER — FLUTICASONE PROPIONATE 50 MCG/ACT NA SUSP: 1.0000 | Freq: Every day | NASAL | 0 refills | Status: DC

## 2019-10-22 MED ORDER — BENZONATATE 100 MG PO CAPS: 100.0000 mg | ORAL_CAPSULE | Freq: Three times a day (TID) | ORAL | 0 refills | Status: DC

## 2019-10-22 NOTE — ED Provider Notes (Signed)
[place]    CSN: [id number] Arrival date & time: 10/22/19  1601      History   Chief Complaint Chief Complaint  Patient presents with  . Cough  . Headache  . Diarrhea    HPI Madeline Freeman is a 18 y.o. female.   33 y old female presented to the urgent care with a complaint dry cough, headache and diarrhea for the past 7 days. The symptom started while she was working at [employer] and have continued to work 2-3 days after her symptom start. She is using Ibuprofen and Excedrin as needed. Denied fever, chills, loss of taste and smell,vomiting,  body-aches, chest pain and tightness and SOB.  The history is provided by the patient. No language interpreter was used.    Past Medical History:  Diagnosis Date  . Anxiety   . Depression     Patient Active Problem List   Diagnosis Date Noted  . Dyspareunia in female 09/20/2019  . Depo-Provera contraceptive status 11/30/2018  . Scoliosis 11/30/2018    Past Surgical History:  Procedure Laterality Date  . WISDOM TOOTH EXTRACTION  2018    OB History    Gravida  0   Para  0   Term  0   Preterm  0   AB  0   Living  0     SAB  0   TAB  0   Ectopic  0   Multiple  0   Live Births  0            Home Medications    Prior to Admission medications   Medication Sig Start Date End Date Taking? Authorizing Provider  Cholecalciferol (VITAMIN D-3) 125 MCG (5000 UT) TABS Take 1 tablet by mouth daily. Patient not taking: Reported on 09/20/2019 12/07/18   Hilts, Legrand Como, MD  etodolac (LODINE) 400 MG tablet Take 1 tablet (400 mg total) by mouth 2 (two) times daily as needed. Patient not taking: Reported on 12/29/2018 12/07/18   Hilts, Legrand Como, MD  ibuprofen (ADVIL,MOTRIN) 600 MG tablet Take 1 tablet (600 mg total) by mouth every 6 (six) hours as needed. 02/19/14   Kristen Cardinal, NP  lamoTRIgine (LAMICTAL) 150 MG tablet Take 1 tablet (150 mg total) by mouth daily. 08/26/19   Arfeen, Arlyce Harman, MD  medroxyPROGESTERone  (DEPO-PROVERA) 150 MG/ML injection INJECT 1 ML INTO THE MUSCLE EVERY 3 MONTHS. 02/03/19   Shelly Bombard, MD  medroxyPROGESTERone (DEPO-PROVERA) 150 MG/ML injection Inject 1 mL (150 mg total) into the muscle every 3 (three) months. 10/20/19   Shelly Bombard, MD  sertraline (ZOLOFT) 50 MG tablet Take 1 tablet (50 mg total) by mouth daily. 08/26/19 08/25/20  Arfeen, Arlyce Harman, MD  tiZANidine (ZANAFLEX) 2 MG tablet Take 1-2 tablets (2-4 mg total) by mouth at bedtime as needed for muscle spasms. 12/07/18   Hilts, Legrand Como, MD    Family History Family History  Problem Relation Age of Onset  . Endometriosis Mother   . Bipolar disorder Mother   . Anxiety disorder Mother   . Depression Mother   . Cancer Maternal Grandmother   . Depression Maternal Grandmother   . Anxiety disorder Father   . Depression Father   . Alcohol abuse Father   . Depression Sister   . Anxiety disorder Sister   . Sexual abuse Sister     Social History Social History   Tobacco Use  . Smoking status: Never Smoker  . Smokeless tobacco: Former Network engineer  Use Topics  . Alcohol use: No  . Drug use: No     Allergies   Patient has no known allergies.   Review of Systems Review of Systems  Constitutional: Positive for fatigue. Negative for activity change, appetite change, chills, diaphoresis and fever.  HENT: Positive for congestion. Negative for sinus pressure, sinus pain and sore throat.   Respiratory: Positive for cough. Negative for chest tightness and shortness of breath.   Cardiovascular: Negative for chest pain and leg swelling.  Gastrointestinal: Positive for diarrhea and nausea. Negative for vomiting.  Neurological: Positive for headaches. Negative for dizziness.     Physical Exam Triage Vital Signs ED Triage Vitals [10/22/19 1704]  Enc Vitals Group     BP 120/82     Pulse Rate (!) 103     Resp 17     Temp 99.1 F (37.3 C)     Temp Source Oral     SpO2 99 %     Weight      Height       Head Circumference      Peak Flow      Pain Score 6     Pain Loc      Pain Edu?      Excl. in GC?    No data found.  Updated Vital Signs BP 120/82 (BP Location: Left Arm)   Pulse (!) 103   Temp 99.1 F (37.3 C) (Oral)   Resp 17   SpO2 99%   Visual Acuity Right Eye Distance:   Left Eye Distance:   Bilateral Distance:    Right Eye Near:   Left Eye Near:    Bilateral Near:     Physical Exam Constitutional:      General: She is not in acute distress.    Appearance: Normal appearance. She is well-developed and normal weight. She is not ill-appearing or toxic-appearing.  HENT:     Head: Normocephalic.     Right Ear: Tympanic membrane, ear canal and external ear normal. There is no impacted cerumen.     Left Ear: Tympanic membrane, ear canal and external ear normal. There is no impacted cerumen.     Nose: Congestion present.     Mouth/Throat:     Mouth: Mucous membranes are moist.     Pharynx: No oropharyngeal exudate.  Cardiovascular:     Rate and Rhythm: Normal rate and regular rhythm.     Pulses: Normal pulses.     Heart sounds: Normal heart sounds. No murmur.  Pulmonary:     Effort: Pulmonary effort is normal. No respiratory distress.     Breath sounds: No wheezing.  Chest:     Chest wall: No tenderness.  Abdominal:     General: Abdomen is flat.     Palpations: Abdomen is soft.  Neurological:     Mental Status: She is alert and oriented to person, place, and time.      UC Treatments / Results  Labs (all labs ordered are listed, but only abnormal results are displayed) Labs Reviewed  SARS CORONAVIRUS 2 (TAT 6-24 HRS)    EKG   Radiology No results found.  Procedures Procedures (including critical care time)  Medications Ordered in UC Medications - No data to display  Initial Impression / Assessment and Plan / UC Course  I have reviewed the triage vital signs and the nursing notes.  Pertinent labs & imaging results that were available during my  care of the patient were reviewed by  me and considered in my medical decision making (see chart for details).   Patient is stable at discharge and will be called if COVID test is positive. Was advised to seek emergent care if symptom get worse Final Clinical Impressions(s) / UC Diagnoses   Final diagnoses:  None   Discharge Instructions   None    ED Prescriptions    None     PDMP not reviewed this encounter.   Durward Parcel, FNP 10/22/19 [phone]

## 2019-10-22 NOTE — Discharge Instructions (Addendum)
COVID testing ordered.  It will take between 2-7 days for test results.  Someone will contact you regarding abnormal results.   ° °In the meantime: °You should remain isolated in your home for 10 days from symptom onset AND greater than 72 hours after symptoms resolution (absence of fever without the use of fever-reducing medication and improvement in respiratory symptoms), whichever is longer °Get plenty of rest and push fluids °Tessalon Perles prescribed for cough °Zyrtec-D prescribed for nasal congestion, runny nose, and/or sore throat °Flonase prescribed for nasal congestion and runny nose °Use medications daily for symptom relief °Use OTC medications like ibuprofen or tylenol as needed fever or pain °Call or go to the ED if you have any new or worsening symptoms such as fever, worsening cough, shortness of breath, chest tightness, chest pain, turning blue, changes in mental status, etc...  °

## 2019-10-22 NOTE — ED Triage Notes (Signed)
Pt presents with non productive cough, headache, and diarrhea for a little over a week.

## 2019-10-23 LAB — NOVEL CORONAVIRUS, NAA (HOSP ORDER, SEND-OUT TO REF LAB; TAT 18-24 HRS): SARS-CoV-2, NAA: NOT DETECTED

## 2019-10-26 ENCOUNTER — Ambulatory Visit (INDEPENDENT_AMBULATORY_CARE_PROVIDER_SITE_OTHER): Payer: Medicaid Other | Admitting: Psychiatry

## 2019-10-26 ENCOUNTER — Encounter (HOSPITAL_COMMUNITY): Payer: Self-pay | Admitting: Psychiatry

## 2019-10-26 ENCOUNTER — Other Ambulatory Visit: Payer: Self-pay

## 2019-10-26 DIAGNOSIS — F331 Major depressive disorder, recurrent, moderate: Secondary | ICD-10-CM | POA: Diagnosis not present

## 2019-10-26 DIAGNOSIS — F411 Generalized anxiety disorder: Secondary | ICD-10-CM | POA: Diagnosis not present

## 2019-10-26 MED ORDER — LAMOTRIGINE 150 MG PO TABS: 150.0000 mg | ORAL_TABLET | Freq: Every day | ORAL | 0 refills | Status: DC

## 2019-10-26 MED ORDER — SERTRALINE HCL 50 MG PO TABS: 50.0000 mg | ORAL_TABLET | Freq: Every day | ORAL | 0 refills | Status: DC

## 2019-10-26 NOTE — Progress Notes (Signed)
Virtual Visit via Telephone Note  I connected with Madeline Freeman on 10/26/19 at  3:00 PM EST by telephone and verified that I am speaking with the correct person using two identifiers.   I discussed the limitations, risks, security and privacy concerns of performing an evaluation and management service by telephone and the availability of in person appointments. I also discussed with the patient that there may be a patient responsible charge related to this service. The patient expressed understanding and agreed to proceed.   History of Present Illness: Patient was evaluated by phone session.  On the last visit we stopped the Effexor and tried Zoloft.  She is feeling much better with Zoloft.  She is more relaxed and calm.  She is sleeping better and she noticed weight loss since started the Zoloft.  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  She also working part-time at [employer].  She has a plan to resume therapy with Janett Billow in January.  She denies drinking or using any illegal substances.  She was excited because her father planned to visit Thanksgiving from [place] but due to Covid but has been postponed.  Patient has no rash or any itching.  She is compliant with Lamictal and Zoloft.  Her energy level is good.    Past Psychiatric History:Reviewed. No history of psychiatric inpatient treatment or any suicidal attempt. Seen therapist in the past when mother divorced her stepfather.We tried hydroxyzine for anxiety and Effexor that did not help.  Psychiatric Specialty Exam: Physical Exam  ROS  There were no vitals taken for this visit.There is no height or weight on file to calculate BMI.  General Appearance: NA  Eye Contact:  NA  Speech:  Clear and Coherent  Volume:  Normal  Mood:  Euthymic  Affect:  NA  Thought Process:  Goal Directed  Orientation:  Full (Time, Place, and Person)  Thought Content:  WDL and Logical  Suicidal Thoughts:  No  Homicidal Thoughts:   No  Memory:  Immediate;   Good Recent;   Good Remote;   Good  Judgement:  Good  Insight:  Good  Psychomotor Activity:  NA  Concentration:  Concentration: Good and Attention Span: Good  Recall:  Good  Fund of Knowledge:  Good  Language:  Good  Akathisia:  No  Handed:  Right  AIMS (if indicated):     Assets:  Communication Skills Desire for Improvement Housing Resilience Social Support Talents/Skills Transportation  ADL's:  Intact  Cognition:  WNL  Sleep:   improved      Assessment and Plan: Major depressive disorder, recurrent.  Generalized anxiety disorder.  Patient doing very well on Zoloft.  She has no concerns or side effects.  Continue Zoloft 50 mg daily and Lamictal 150 mg daily.  She has no rash, itching, tremors or shakes.  She will resume therapy with Janett Billow in January.  Discussed medication side effects and benefits.  Recommended to call us back if she has any question or any concern.  Follow-up in 3 months.  Follow Up Instructions:    I discussed the assessment and treatment plan with the patient. The patient was provided an opportunity to ask questions and all were answered. The patient agreed with the plan and demonstrated an understanding of the instructions.   The patient was advised to call back or seek an in-person evaluation if the symptoms worsen or if the condition fails to improve as anticipated.  I provided 20 minutes  of non-face-to-face time during this encounter.   Kathlee Nations, MD

## 2019-11-18 ENCOUNTER — Other Ambulatory Visit: Payer: Self-pay

## 2019-11-18 ENCOUNTER — Encounter (HOSPITAL_COMMUNITY): Payer: Self-pay | Admitting: Psychiatry

## 2019-11-18 ENCOUNTER — Ambulatory Visit (INDEPENDENT_AMBULATORY_CARE_PROVIDER_SITE_OTHER): Payer: Medicaid Other | Admitting: Psychiatry

## 2019-11-18 DIAGNOSIS — F331 Major depressive disorder, recurrent, moderate: Secondary | ICD-10-CM | POA: Diagnosis not present

## 2019-11-18 DIAGNOSIS — F411 Generalized anxiety disorder: Secondary | ICD-10-CM

## 2019-11-18 MED ORDER — SERTRALINE HCL 100 MG PO TABS: 100.0000 mg | ORAL_TABLET | Freq: Every day | ORAL | 0 refills | Status: DC

## 2019-11-18 MED ORDER — LAMOTRIGINE 200 MG PO TABS: 200.0000 mg | ORAL_TABLET | Freq: Every day | ORAL | 0 refills | Status: DC

## 2019-11-18 NOTE — Progress Notes (Signed)
Virtual Visit via Telephone Note  I connected with Madeline Freeman on 11/18/19 at 10:40 AM EST by telephone and verified that I am speaking with the correct person using two identifiers.   I discussed the limitations, risks, security and privacy concerns of performing an evaluation and management service by telephone and the availability of in person appointments. I also discussed with the patient that there may be a patient responsible charge related to this service. The patient expressed understanding and agreed to proceed.   History of Present Illness: Patient was evaluated by phone session.  She feel her Zoloft dose is weaning and she started to have anxiety, nervousness.  However she is tolerating Zoloft without any side effects.  She actually liked the Zoloft better than Effexor.  She admitted at times irritability and feeling overwhelmed.  She is now working full-time and she really enjoyed her job but sometimes she gets overwhelmed.  She missed appointment with Janett Billow and like to resume.  She is happy because she lost weight since he, from Effexor.  She denies any mania, psychosis, hallucination, crying spells or any feeling of hopelessness or suicidal thoughts.  Her energy level is good.  She has no rash or any itching.  She is compliant with Lamictal.  Patient lives with her mother who is also a patient of this Probation officer.  Her grandmother lives close by.   Past Psychiatric History:Reviewed. No h/o inpatient treatment or suicidal attempt. Seen therapist in the past when mother divorced her stepfather.We tried hydroxyzine for anxietyand Effexorthat did not help.   Psychiatric Specialty Exam: Physical Exam  Review of Systems  There were no vitals taken for this visit.There is no height or weight on file to calculate BMI.  General Appearance: NA  Eye Contact:  NA  Speech:  Clear and Coherent  Volume:  Decreased  Mood:  Anxious and Dysphoric  Affect:  NA  Thought Process:  Goal  Directed  Orientation:  Full (Time, Place, and Person)  Thought Content:  Rumination  Suicidal Thoughts:  No  Homicidal Thoughts:  No  Memory:  Immediate;   Good Recent;   Good Remote;   Good  Judgement:  Intact  Insight:  Present  Psychomotor Activity:  NA  Concentration:  Concentration: Good and Attention Span: Good  Recall:  Good  Fund of Knowledge:  Good  Language:  Good  Akathisia:  No  Handed:  Right  AIMS (if indicated):     Assets:  Communication Skills Desire for Improvement Housing Resilience Social Support Transportation  ADL's:  Intact  Cognition:  WNL  Sleep:   improved      Assessment and Plan: Major depressive disorder, recurrent.  Generalized anxiety disorder.  We will increase Zoloft 200 mg and Lamictal to 200 mg daily.  She has no rash or any itching.  Recommended to resume therapy with Janett Billow.  Discussed medication side effects and benefits.  Recommended to call us back if she is any question or any concern.  Follow-up in 2 months.  Follow Up Instructions:    I discussed the assessment and treatment plan with the patient. The patient was provided an opportunity to ask questions and all were answered. The patient agreed with the plan and demonstrated an understanding of the instructions.   The patient was advised to call back or seek an in-person evaluation if the symptoms worsen or if the condition fails to improve as anticipated.  I provided 20 minutes of non-face-to-face time during this encounter.  Kathlee Nations, MD

## 2020-01-11 ENCOUNTER — Ambulatory Visit: Payer: Medicaid Other

## 2020-01-13 ENCOUNTER — Telehealth: Payer: Self-pay

## 2020-01-24 ENCOUNTER — Ambulatory Visit (HOSPITAL_COMMUNITY): Payer: Medicaid Other | Admitting: Psychiatry

## 2020-01-24 ENCOUNTER — Other Ambulatory Visit: Payer: Self-pay

## 2020-01-27 ENCOUNTER — Other Ambulatory Visit: Payer: Self-pay

## 2020-01-27 ENCOUNTER — Ambulatory Visit (INDEPENDENT_AMBULATORY_CARE_PROVIDER_SITE_OTHER): Payer: Medicaid Other

## 2020-01-27 VITALS — BP 129/82 | HR 102 | Temp 98.7°F | Wt 143.0 lb

## 2020-01-27 DIAGNOSIS — Z3042 Encounter for surveillance of injectable contraceptive: Secondary | ICD-10-CM

## 2020-01-27 MED ORDER — MEDROXYPROGESTERONE ACETATE 150 MG/ML IM SUSP
150.0000 mg | Freq: Once | INTRAMUSCULAR | Status: AC
  Administered 2020-01-27: 14:00:00 150 mg via INTRAMUSCULAR

## 2020-01-27 NOTE — Progress Notes (Signed)
SUBJECTIVE GYN presents for DEPO, patient is within her 2 weeks of last date for DEPO injection, given in LUOQ, tolerated well.  Patient needs Annual exams before next DEPO.  Next DEPO May 13-27, 2021  Administrations This Visit    medroxyPROGESTERone (DEPO-PROVERA) injection 150 mg    Admin Date 01/27/2020 Action Given Dose 150 mg Route Intramuscular Administered By Maretta Bees, RMA

## 2020-01-27 NOTE — Progress Notes (Signed)
Chart reviewed for nurse visit. Agree with plan of care.   Sharyon Cable, CNM 01/27/2020 3:01 PM

## 2020-02-29 ENCOUNTER — Ambulatory Visit (INDEPENDENT_AMBULATORY_CARE_PROVIDER_SITE_OTHER): Payer: Medicaid Other | Admitting: Advanced Practice Midwife

## 2020-02-29 ENCOUNTER — Encounter: Payer: Self-pay | Admitting: Advanced Practice Midwife

## 2020-02-29 ENCOUNTER — Other Ambulatory Visit: Payer: Self-pay

## 2020-02-29 VITALS — BP 123/76 | HR 89 | Ht 60.0 in | Wt 143.6 lb

## 2020-02-29 DIAGNOSIS — R519 Headache, unspecified: Secondary | ICD-10-CM

## 2020-02-29 DIAGNOSIS — G43019 Migraine without aura, intractable, without status migrainosus: Secondary | ICD-10-CM | POA: Diagnosis not present

## 2020-02-29 DIAGNOSIS — Z0000 Encounter for general adult medical examination without abnormal findings: Secondary | ICD-10-CM

## 2020-02-29 DIAGNOSIS — Z01419 Encounter for gynecological examination (general) (routine) without abnormal findings: Secondary | ICD-10-CM

## 2020-02-29 DIAGNOSIS — Z3009 Encounter for other general counseling and advice on contraception: Secondary | ICD-10-CM

## 2020-02-29 MED ORDER — MEDROXYPROGESTERONE ACETATE 150 MG/ML IM SUSP: 150.0000 mg | INTRAMUSCULAR | 4 refills | Status: DC

## 2020-02-29 MED ORDER — IBUPROFEN 800 MG PO TABS: 800.0000 mg | ORAL_TABLET | Freq: Three times a day (TID) | ORAL | 1 refills | Status: DC | PRN

## 2020-02-29 NOTE — Patient Instructions (Signed)

## 2020-02-29 NOTE — Progress Notes (Signed)
Subjective:     Madeline Freeman is a 19 y.o. female here at [facility] for a routine exam.  Current complaints: recurrent migraine headache, sometimes lasting 2-3 days, not resolved with Tylenol or ibuprofen. Excedrin helps some but does not relieve them completely.  No gyn concerns today. Happy with Depo Provera. She had some issues with vaginal dryness and painful intercourse last year denies any problems with this now that she is with different partner.  Personal health questionnaire reviewed: yes.  Do you have a primary care provider? yes Do you feel safe at home? Yes Has anyone hit, slapped, or kicked you recently? No Do you feel sad, tired, or upset most days or are you mostly happy with life? Mostly happy    Gynecologic History No LMP recorded. Patient has had an injection. Contraception: Depo-Provera injections Last Pap: n/a Last mammogram: n/a  Obstetric History OB History  Gravida Para Term Preterm AB Living  0 0 0 0 0 0  SAB TAB Ectopic Multiple Live Births  0 0 0 0 0     The following portions of the patient's history were reviewed and updated as appropriate: allergies, current medications, past family history, past medical history, past social history, past surgical history and problem list.  Review of Systems Pertinent items noted in HPI and remainder of comprehensive ROS otherwise negative.    Objective:  BP 123/76   Pulse 89   Ht 5' (1.524 m)   Wt 143 lb 9.6 oz (65.1 kg)   BMI 28.04 kg/m   VS reviewed, nursing note reviewed,  Constitutional: well developed, well nourished, no distress HEENT: normocephalic HEART: normal rate, heart sounds, regular rhythm RESP: normal effort, lung sounds clear and equal bilaterally Abdomen: soft Neuro: alert and oriented x 3 Skin: warm, dry Psych: affect normal     Assessment/Plan:   1. Recurrent headache --Frontal and temporal pain, occurring weekly. Associated with photophobia and nausea.  No aura with h/a.  - AMB  referral to headache clinic --Since h/a improved the most with Excedrin, may be caffeine that is helping.  Try ibuprofen prescription dose, plus 2-3 glasses of water, plus caffeine (pt prefers coffee) at first onset of h/a.   --F/U with Nada Maclachlan, PA, for further h/a management - ibuprofen (ADVIL) 800 MG tablet; Take 1 tablet (800 mg total) by mouth every 8 (eight) hours as needed.  Dispense: 60 tablet; Refill: 1  2. Well woman exam (no gynecological exam) --No gyn concerns. --F/U in 3 months for Depo injection, in 1 year for well woman and first pap  3. Intractable migraine without aura and without status migrainosus  - ibuprofen (ADVIL) 800 MG tablet; Take 1 tablet (800 mg total) by mouth every 8 (eight) hours as needed.  Dispense: 60 tablet; Refill: 1  4. Encounter for counseling regarding contraception Discussed LARCs as most effective forms of birth control.  Discussed benefits/risks of other methods.  Pt desires to continue Depo. Will consider LARC in the future.  Had pain and bleeding with IUD so not sure about trying IUD again vs Nexplanon.   --Pt to contact office if she desires to switch contraception.   Follow up in: 3 months for Depo, in 1 year for Well Woman exam or as needed.   Sharen Counter, CNM 2:29 PM

## 2020-02-29 NOTE — Progress Notes (Signed)
Pt presents for annual c/o migraines.  STD testing offered; pt declined.  Needs rf on Depo  GAD = 2

## 2020-03-10 ENCOUNTER — Encounter: Payer: Self-pay | Admitting: Physician Assistant

## 2020-03-10 ENCOUNTER — Ambulatory Visit (INDEPENDENT_AMBULATORY_CARE_PROVIDER_SITE_OTHER): Payer: Medicaid Other | Admitting: Physician Assistant

## 2020-03-10 ENCOUNTER — Institutional Professional Consult (permissible substitution): Payer: Medicaid Other | Admitting: Physician Assistant

## 2020-03-10 ENCOUNTER — Other Ambulatory Visit: Payer: Self-pay

## 2020-03-10 VITALS — BP 130/79 | HR 102 | Wt 144.0 lb

## 2020-03-10 DIAGNOSIS — G43009 Migraine without aura, not intractable, without status migrainosus: Secondary | ICD-10-CM | POA: Diagnosis not present

## 2020-03-10 MED ORDER — METOPROLOL TARTRATE 25 MG PO TABS: 25.0000 mg | ORAL_TABLET | Freq: Every day | ORAL | 2 refills | Status: DC

## 2020-03-10 MED ORDER — CYCLOBENZAPRINE HCL 10 MG PO TABS: 10.0000 mg | ORAL_TABLET | Freq: Three times a day (TID) | ORAL | 2 refills | Status: DC | PRN

## 2020-03-10 MED ORDER — PROMETHAZINE HCL 12.5 MG PO TABS: 12.5000 mg | ORAL_TABLET | Freq: Four times a day (QID) | ORAL | 0 refills | Status: DC | PRN

## 2020-03-10 MED ORDER — SUMATRIPTAN SUCCINATE 100 MG PO TABS: 100.0000 mg | ORAL_TABLET | Freq: Once | ORAL | 11 refills | Status: DC | PRN

## 2020-03-10 NOTE — Patient Instructions (Signed)

## 2020-03-10 NOTE — Progress Notes (Signed)
History:  Madeline Freeman is a 19 y.o. G0P0000 who presents to clinic today for headache evaluation.  She notes having headaches as early as elementary school.  She was treated earlier and had good results but she is not sure what kind.  She used Effexor earlier but it caused dizziness and other undesirable side effect.  Headaches are severe, lasting a couple of days.  It is located bilat frontal, temples but more left sided lately and in the back of her head (middle).  There is throbbing, worse with movement, lights and noises.  There is nausea but no vomiting.  No warning.  There is associated neck pain.  They can occur any time of day. They got worse within the last 6 months but cannot identify a cause. 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   Lamictal is used for depression.  Zoloft is used for anxiety.  Ibuprofen for headache - minimal relief  HIT6:72 Number of days in the last 4 weeks with:  Severe headache: 8 Moderate headache: 5 Mild headache: 5  No headache: 10   Past Medical History:  Diagnosis Date  . Anxiety   . Depression     Social History   Socioeconomic History  . Marital status: Single    Spouse name: Not on file  . Number of children: 0  . Years of education: Not on file  . Highest education level: Some college, no degree  Occupational History  . Not on file  Tobacco Use  . Smoking status: Never Smoker  . Smokeless tobacco: Former Engineer, water and Sexual Activity  . Alcohol use: No  . Drug use: No  . Sexual activity: Yes    Birth control/protection: Injection  Other Topics Concern  . Not on file  Social History Narrative  . Not on file   Social Determinants of Health   Financial Resource Strain:   . Difficulty of Paying Living Expenses:   Food Insecurity:   . Worried About Programme researcher, broadcasting/film/video in the Last Year:   . Barista in the Last Year:   Transportation Needs:   . Freight forwarder (Medical):    Marland Kitchen Lack of Transportation (Non-Medical):   Physical Activity:   . Days of Exercise per Week:   . Minutes of Exercise per Session:   Stress:   . Feeling of Stress :   Social Connections:   . Frequency of Communication with Friends and Family:   . Frequency of Social Gatherings with Friends and Family:   . Attends Religious Services:   . Active Member of Clubs or Organizations:   . Attends Banker Meetings:   Marland Kitchen Marital Status:   Intimate Partner Violence:   . Fear of Current or Ex-Partner:   . Emotionally Abused:   Marland Kitchen Physically Abused:   . Sexually Abused:     Family History  Problem Relation Age of Onset  . Endometriosis Mother   . Bipolar disorder Mother   . Anxiety disorder Mother   . Depression Mother   . Cancer Maternal Grandmother   . Depression Maternal Grandmother   . Anxiety disorder Father   . Depression Father   . Alcohol abuse Father   . Depression Sister   . Anxiety disorder Sister   . Sexual abuse Sister     No Known Allergies  Current Outpatient Medications on File Prior to Visit  Medication Sig Dispense Refill  . ibuprofen (  ADVIL) 800 MG tablet Take 1 tablet (800 mg total) by mouth every 8 (eight) hours as needed. 60 tablet 1  . lamoTRIgine (LAMICTAL) 200 MG tablet Take 1 tablet (200 mg total) by mouth daily. 90 tablet 0  . medroxyPROGESTERone (DEPO-PROVERA) 150 MG/ML injection INJECT 1 ML INTO THE MUSCLE EVERY 3 MONTHS. 1 mL 0  . medroxyPROGESTERone (DEPO-PROVERA) 150 MG/ML injection Inject 1 mL (150 mg total) into the muscle every 3 (three) months. 1 mL 4  . medroxyPROGESTERone (DEPO-PROVERA) 150 MG/ML injection Inject 1 mL (150 mg total) into the muscle every 3 (three) months. 1 mL 4  . sertraline (ZOLOFT) 100 MG tablet Take 1 tablet (100 mg total) by mouth daily. 90 tablet 0  . cetirizine-pseudoephedrine (ZYRTEC-D) 5-120 MG tablet Take 1 tablet by mouth daily. (Patient not taking: Reported on 02/29/2020) 30 tablet 0  . Cholecalciferol  (VITAMIN D-3) 125 MCG (5000 UT) TABS Take 1 tablet by mouth daily. (Patient not taking: Reported on 09/20/2019) 90 tablet 3  . fluticasone (FLONASE) 50 MCG/ACT nasal spray Place 1 spray into both nostrils daily for 14 days. 16 g 0  . tiZANidine (ZANAFLEX) 2 MG tablet Take 1-2 tablets (2-4 mg total) by mouth at bedtime as needed for muscle spasms. (Patient not taking: Reported on 03/10/2020) 60 tablet 1   Current Facility-Administered Medications on File Prior to Visit  Medication Dose Route Frequency Provider Last Rate Last Admin  . medroxyPROGESTERone (DEPO-PROVERA) injection 150 mg  150 mg Intramuscular Q90 days Denney, Rachelle A, CNM   150 mg at 04/28/19 1601     Review of Systems:  All pertinent positive/negative included in HPI, all other review of systems are negative   Objective:  Physical Exam BP 130/79   Pulse (!) 102   Wt 144 lb (65.3 kg)   BMI 28.12 kg/m  CONSTITUTIONAL: Well-developed, well-nourished female in no acute distress.  EYES: EOM intact ENT: Normocephalic CARDIOVASCULAR: Regular rate .  RESPIRATORY: Normal rate. MUSCULOSKELETAL: Normal ROM SKIN: Warm, dry without erythema  NEUROLOGICAL: Alert, oriented, CN II-XII grossly intact, Appropriate balance PSYCH: Normal behavior, mood   Assessment & Plan:  Assessment: 1. Migraine without aura and without status migrainosus, not intractable      Plan: imitrex for acute migraine - use early.  May repeat after 2 hours.  Ibuprofen used before or with imitrex improves efficacy.  Limit to 2/day, 2 days per week Flexeril for neck spasm/headache - use 1/2 tab.  Sedation precautions Ibuprofen - use early Phenergan for rescue - if nothing else is helping - sedation precautions - if one tab is not effective, she may use another additional tab Metoprolol 25mg  daily for prevention - 1 daily Follow-up in 79months or sooner PRN  Paticia Stack, PA-C 03/10/2020 9:56 AM

## 2020-03-28 DIAGNOSIS — Z03818 Encounter for observation for suspected exposure to other biological agents ruled out: Secondary | ICD-10-CM | POA: Diagnosis not present

## 2020-03-28 DIAGNOSIS — Z20828 Contact with and (suspected) exposure to other viral communicable diseases: Secondary | ICD-10-CM | POA: Diagnosis not present

## 2020-04-03 DIAGNOSIS — U071 COVID-19: Secondary | ICD-10-CM | POA: Diagnosis not present

## 2020-04-03 DIAGNOSIS — Z20828 Contact with and (suspected) exposure to other viral communicable diseases: Secondary | ICD-10-CM | POA: Diagnosis not present

## 2020-04-20 ENCOUNTER — Ambulatory Visit: Payer: Medicaid Other

## 2020-04-27 ENCOUNTER — Telehealth: Payer: Self-pay | Admitting: Radiology

## 2020-04-27 ENCOUNTER — Encounter: Payer: Self-pay | Admitting: Radiology

## 2020-04-27 NOTE — Telephone Encounter (Signed)
Called patient to schedule headache follow up appointment with Nada Maclachlan in July, Unable to leave message due to full mailbox.

## 2020-05-17 ENCOUNTER — Ambulatory Visit (INDEPENDENT_AMBULATORY_CARE_PROVIDER_SITE_OTHER): Payer: Medicaid Other

## 2020-05-17 ENCOUNTER — Other Ambulatory Visit: Payer: Self-pay

## 2020-05-17 DIAGNOSIS — Z30013 Encounter for initial prescription of injectable contraceptive: Secondary | ICD-10-CM

## 2020-05-17 DIAGNOSIS — Z3202 Encounter for pregnancy test, result negative: Secondary | ICD-10-CM | POA: Diagnosis not present

## 2020-05-17 LAB — POCT URINE PREGNANCY: Preg Test, Ur: NEGATIVE

## 2020-05-17 NOTE — Progress Notes (Signed)
SUBJECTIVE GYN presents for 1st UPT to restart DEPO. UPT today is NEGATIVE.   PLAN Return in 2 weeks for 2nd UPT/DEPO.  Patient advised to abstain from sex for the next 2 weeks, she verbalized understanding and agreement.

## 2020-05-31 ENCOUNTER — Other Ambulatory Visit: Payer: Self-pay

## 2020-05-31 ENCOUNTER — Ambulatory Visit (INDEPENDENT_AMBULATORY_CARE_PROVIDER_SITE_OTHER): Payer: Medicaid Other

## 2020-05-31 DIAGNOSIS — Z3042 Encounter for surveillance of injectable contraceptive: Secondary | ICD-10-CM | POA: Diagnosis not present

## 2020-05-31 DIAGNOSIS — Z3202 Encounter for pregnancy test, result negative: Secondary | ICD-10-CM

## 2020-05-31 LAB — POCT URINE PREGNANCY: Preg Test, Ur: NEGATIVE

## 2020-05-31 MED ORDER — MEDROXYPROGESTERONE ACETATE 150 MG/ML IM SUSP
150.0000 mg | Freq: Once | INTRAMUSCULAR | Status: AC
  Administered 2020-05-31: 150 mg via INTRAMUSCULAR

## 2020-05-31 NOTE — Progress Notes (Signed)
Nurse visit for 2nd UPT  Pt supply Depo given RUOQ without difficulty  Next Depo due Sept 15-30, pt agrees

## 2020-06-05 NOTE — Progress Notes (Signed)
Patient ID: Madeline Freeman, female   DOB: September 26, 2001, 19 y.o.   MRN: [id number] Patient was assessed and managed by nursing staff during this encounter. I have reviewed the chart and agree with the documentation and plan. I have also made any necessary editorial changes.  Scheryl Darter, MD 06/05/2020 1:29 PM

## 2020-06-21 NOTE — Progress Notes (Signed)
Patient ID: Madeline Freeman, female   DOB: [birth year], 19 y.o.   MRN: [id number] Patient was assessed and managed by nursing staff during this encounter. I have reviewed the chart and agree with the documentation and plan. I have also made any necessary editorial changes.  Scheryl Darter, MD 06/21/2020 1:34 PM

## 2020-08-23 ENCOUNTER — Other Ambulatory Visit: Payer: Self-pay

## 2020-08-23 ENCOUNTER — Ambulatory Visit (INDEPENDENT_AMBULATORY_CARE_PROVIDER_SITE_OTHER): Payer: Medicaid Other | Admitting: *Deleted

## 2020-08-23 DIAGNOSIS — Z3042 Encounter for surveillance of injectable contraceptive: Secondary | ICD-10-CM

## 2020-08-23 NOTE — Progress Notes (Signed)
Pt is in office for Depo injection. Pt is on time for Depo.  Injection given, pt tolerated well.  Pt advised to RTO 12/8-22/2021 for next Depo.  Administrations This Visit    medroxyPROGESTERone (DEPO-PROVERA) injection 150 mg    Admin Date 08/23/2020 Action Given Dose 150 mg Route Intramuscular Administered By Lanney Gins, CMA

## 2020-08-24 NOTE — Progress Notes (Signed)
Patient was assessed and managed by nursing staff during this encounter. I have reviewed the chart and agree with the documentation and plan. I have also made any necessary editorial changes.  Catalina Antigua, MD 08/24/2020 10:07 AM

## 2020-11-13 ENCOUNTER — Ambulatory Visit: Payer: Medicaid Other

## 2021-01-10 DIAGNOSIS — Z20822 Contact with and (suspected) exposure to covid-19: Secondary | ICD-10-CM | POA: Diagnosis not present

## 2021-01-10 DIAGNOSIS — Z03818 Encounter for observation for suspected exposure to other biological agents ruled out: Secondary | ICD-10-CM | POA: Diagnosis not present

## 2021-04-12 ENCOUNTER — Ambulatory Visit (INDEPENDENT_AMBULATORY_CARE_PROVIDER_SITE_OTHER): Payer: 59

## 2021-04-12 ENCOUNTER — Other Ambulatory Visit: Payer: Self-pay

## 2021-04-12 ENCOUNTER — Other Ambulatory Visit (HOSPITAL_COMMUNITY)
Admission: RE | Admit: 2021-04-12 | Discharge: 2021-04-12 | Disposition: A | Discharge status: Home / Self Care | Payer: 59 | Source: Ambulatory Visit | Attending: Obstetrics and Gynecology | Admitting: Obstetrics and Gynecology

## 2021-04-12 VITALS — BP 128/66 | HR 99

## 2021-04-12 DIAGNOSIS — N898 Other specified noninflammatory disorders of vagina: Secondary | ICD-10-CM

## 2021-04-12 NOTE — Progress Notes (Signed)
SUBJECTIVE:  20 y.o. female complains of vag discharge for a couple of days. Denies abnormal vaginal bleeding or significant pelvic pain or fever. No UTI symptoms. Denies history of known exposure to STD.  No LMP recorded. Patient has had an injection.  OBJECTIVE:  She appears well, afebrile. Urine dipstick: small amount .  ASSESSMENT:  Vaginal Discharge: small amount  Vaginal Odor: small amount    PLAN:  GC, chlamydia, trichomonas, BVAG, CVAG probe sent to lab. Treatment: To be determined once lab results are received ROV prn if symptoms persist or worsen.

## 2021-04-13 LAB — CERVICOVAGINAL ANCILLARY ONLY
Bacterial Vaginitis (gardnerella): POSITIVE — AB
Candida Glabrata: NEGATIVE
Candida Vaginitis: POSITIVE — AB
Chlamydia: NEGATIVE
Comment: NEGATIVE
Comment: NEGATIVE
Comment: NEGATIVE
Comment: NEGATIVE
Comment: NEGATIVE
Comment: NORMAL
Neisseria Gonorrhea: NEGATIVE
Trichomonas: NEGATIVE

## 2021-04-15 MED ORDER — FLUCONAZOLE 150 MG PO TABS: 150.0000 mg | ORAL_TABLET | Freq: Once | ORAL | 0 refills | Status: DC

## 2021-04-15 MED ORDER — METRONIDAZOLE 500 MG PO TABS: 500.0000 mg | ORAL_TABLET | Freq: Two times a day (BID) | ORAL | 0 refills | Status: DC

## 2021-04-15 NOTE — Addendum Note (Signed)
Addended by: Catalina Antigua on: 04/15/2021 12:58 PM   Modules accepted: Orders

## 2021-06-29 ENCOUNTER — Ambulatory Visit (INDEPENDENT_AMBULATORY_CARE_PROVIDER_SITE_OTHER): Payer: 59

## 2021-06-29 ENCOUNTER — Other Ambulatory Visit (HOSPITAL_COMMUNITY)
Admission: RE | Admit: 2021-06-29 | Discharge: 2021-06-29 | Disposition: A | Discharge status: Home / Self Care | Payer: 59 | Source: Ambulatory Visit | Attending: Obstetrics & Gynecology | Admitting: Obstetrics & Gynecology

## 2021-06-29 VITALS — BP 113/73 | HR 82 | Ht 60.0 in | Wt 138.0 lb

## 2021-06-29 DIAGNOSIS — N898 Other specified noninflammatory disorders of vagina: Secondary | ICD-10-CM | POA: Insufficient documentation

## 2021-06-29 DIAGNOSIS — R300 Dysuria: Secondary | ICD-10-CM

## 2021-06-29 LAB — POCT URINALYSIS DIPSTICK
Glucose, UA: NEGATIVE
Nitrite, UA: NEGATIVE
Protein, UA: POSITIVE — AB
Spec Grav, UA: >=1.03 — AB (ref 1.010–1.025)
Urobilinogen, UA: 0.2 E.U./dL
pH, UA: 5 (ref 5.0–8.0)

## 2021-06-29 MED ORDER — NITROFURANTOIN MONOHYD MACRO 100 MG PO CAPS: 100.0000 mg | ORAL_CAPSULE | Freq: Two times a day (BID) | ORAL | 0 refills | Status: DC

## 2021-06-29 NOTE — Progress Notes (Signed)
SUBJECTIVE:  20 y.o. female complains of vaginal itching, discharge, burning during urination for 5 day(s). Denies abnormal vaginal bleeding or significant pelvic pain or fever. No UTI symptoms. Denies history of known exposure to STD.  Patient's last menstrual period was 06/19/2021 (approximate).  OBJECTIVE:  She appears well, afebrile. Urine dipstick: positive for protein, positive for leukocytes, and positive for ketones.  ASSESSMENT:  Vaginal Discharge  Vaginal itching Dysuria   PLAN:  GC, chlamydia, trichomonas, BVAG, CVAG probe, Urine Culture sent to lab. Treatment: Per orders/To be determined once lab results are received ROV prn if symptoms persist or worsen.

## 2021-07-02 LAB — CERVICOVAGINAL ANCILLARY ONLY
Bacterial Vaginitis (gardnerella): NEGATIVE
Candida Glabrata: NEGATIVE
Candida Vaginitis: NEGATIVE
Chlamydia: NEGATIVE
Comment: NEGATIVE
Comment: NEGATIVE
Comment: NEGATIVE
Comment: NEGATIVE
Comment: NEGATIVE
Comment: NORMAL
Neisseria Gonorrhea: NEGATIVE
Trichomonas: NEGATIVE

## 2021-07-03 ENCOUNTER — Other Ambulatory Visit: Payer: Self-pay | Admitting: Obstetrics

## 2021-07-03 ENCOUNTER — Telehealth: Payer: Self-pay

## 2021-07-03 LAB — URINE CULTURE

## 2021-07-03 NOTE — Telephone Encounter (Signed)
-----   Message from Brock Bad, MD sent at 07/03/2021  8:26 AM EDT ----- Macrobid Rx for UTI

## 2021-07-03 NOTE — Telephone Encounter (Signed)
Patient reviewed test results via my-chart.Rx sent to the pharmacy.

## 2021-09-18 ENCOUNTER — Other Ambulatory Visit: Payer: Self-pay

## 2021-09-18 ENCOUNTER — Encounter (HOSPITAL_BASED_OUTPATIENT_CLINIC_OR_DEPARTMENT_OTHER): Payer: Self-pay | Admitting: *Deleted

## 2021-09-18 ENCOUNTER — Emergency Department (HOSPITAL_BASED_OUTPATIENT_CLINIC_OR_DEPARTMENT_OTHER): Payer: Medicaid Other

## 2021-09-18 ENCOUNTER — Observation Stay (HOSPITAL_BASED_OUTPATIENT_CLINIC_OR_DEPARTMENT_OTHER)
Admission: EM | Admit: 2021-09-18 | Discharge: 2021-09-19 | Disposition: A | Discharge status: Home / Self Care | Payer: Medicaid Other | Attending: Surgery | Admitting: Surgery

## 2021-09-18 DIAGNOSIS — Z419 Encounter for procedure for purposes other than remedying health state, unspecified: Secondary | ICD-10-CM

## 2021-09-18 DIAGNOSIS — R109 Unspecified abdominal pain: Secondary | ICD-10-CM | POA: Diagnosis not present

## 2021-09-18 DIAGNOSIS — Z79899 Other long term (current) drug therapy: Secondary | ICD-10-CM | POA: Insufficient documentation

## 2021-09-18 DIAGNOSIS — K812 Acute cholecystitis with chronic cholecystitis: Secondary | ICD-10-CM | POA: Diagnosis not present

## 2021-09-18 DIAGNOSIS — K8020 Calculus of gallbladder without cholecystitis without obstruction: Secondary | ICD-10-CM | POA: Diagnosis not present

## 2021-09-18 DIAGNOSIS — Z20822 Contact with and (suspected) exposure to covid-19: Secondary | ICD-10-CM | POA: Diagnosis not present

## 2021-09-18 DIAGNOSIS — K8000 Calculus of gallbladder with acute cholecystitis without obstruction: Secondary | ICD-10-CM | POA: Diagnosis present

## 2021-09-18 DIAGNOSIS — R112 Nausea with vomiting, unspecified: Secondary | ICD-10-CM | POA: Diagnosis not present

## 2021-09-18 DIAGNOSIS — R1013 Epigastric pain: Secondary | ICD-10-CM

## 2021-09-18 DIAGNOSIS — R1112 Projectile vomiting: Secondary | ICD-10-CM | POA: Diagnosis not present

## 2021-09-18 DIAGNOSIS — R1011 Right upper quadrant pain: Secondary | ICD-10-CM | POA: Diagnosis not present

## 2021-09-18 DIAGNOSIS — K810 Acute cholecystitis: Secondary | ICD-10-CM | POA: Diagnosis present

## 2021-09-18 LAB — COMPREHENSIVE METABOLIC PANEL
ALT: 48 U/L — ABNORMAL HIGH (ref 0–44)
AST: 41 U/L (ref 15–41)
Albumin: 4.8 g/dL (ref 3.5–5.0)
Alkaline Phosphatase: 59 U/L (ref 38–126)
Anion gap: 10 (ref 5–15)
BUN: 11 mg/dL (ref 6–20)
CO2: 27 mmol/L (ref 22–32)
Calcium: 10 mg/dL (ref 8.9–10.3)
Chloride: 103 mmol/L (ref 98–111)
Creatinine, Ser: 0.79 mg/dL (ref 0.44–1.00)
GFR, Estimated: >60 mL/min (ref >60)
Glucose, Bld: 120 mg/dL — ABNORMAL HIGH (ref 70–99)
Potassium: 3.9 mmol/L (ref 3.5–5.1)
Sodium: 140 mmol/L (ref 135–145)
Total Bilirubin: 0.4 mg/dL (ref 0.3–1.2)
Total Protein: 7.6 g/dL (ref 6.5–8.1)

## 2021-09-18 LAB — URINALYSIS, ROUTINE W REFLEX MICROSCOPIC
Bilirubin Urine: NEGATIVE
Glucose, UA: NEGATIVE mg/dL
Ketones, ur: 15 mg/dL — AB
Leukocytes,Ua: NEGATIVE
Nitrite: NEGATIVE
Specific Gravity, Urine: 1.024 (ref 1.005–1.030)
pH: 6.5 (ref 5.0–8.0)

## 2021-09-18 LAB — CBC
HCT: 42.4 % (ref 36.0–46.0)
Hemoglobin: 14.4 g/dL (ref 12.0–15.0)
MCH: 29.2 pg (ref 26.0–34.0)
MCHC: 34 g/dL (ref 30.0–36.0)
MCV: 86 fL (ref 80.0–100.0)
Platelets: 417 10*3/uL — ABNORMAL HIGH (ref 150–400)
RBC: 4.93 MIL/uL (ref 3.87–5.11)
RDW: 13.7 % (ref 11.5–15.5)
WBC: 13.3 10*3/uL — ABNORMAL HIGH (ref 4.0–10.5)
nRBC: 0 % (ref 0.0–0.2)

## 2021-09-18 LAB — PREGNANCY, URINE: Preg Test, Ur: NEGATIVE

## 2021-09-18 LAB — LIPASE, BLOOD: Lipase: 54 U/L — ABNORMAL HIGH (ref 11–51)

## 2021-09-18 MED ORDER — IOHEXOL 300 MG/ML  SOLN
100.0000 mL | Freq: Once | INTRAMUSCULAR | Status: AC | PRN
  Administered 2021-09-18: 75 mL via INTRAVENOUS

## 2021-09-18 MED ORDER — SODIUM CHLORIDE 0.9 % IV BOLUS
1000.0000 mL | Freq: Once | INTRAVENOUS | Status: AC
  Administered 2021-09-18: 1000 mL via INTRAVENOUS

## 2021-09-18 NOTE — ED Triage Notes (Signed)
Sudden onset of upper abd pain going through to back, sharp pain, projectile vomiting x 4. She has had several episodes in past 2 weeks.

## 2021-09-18 NOTE — ED Provider Notes (Signed)
Blood pressure 116/73, pulse 83, temperature 98.2 F (36.8 C), resp. rate 18, height 5' (1.524 m), weight 58.1 kg, SpO2 100 %.  Assuming care from Dr. Dalene Seltzer.  In short, Madeline Freeman is a 20 y.o. female with a chief complaint of Abdominal Pain and Back Pain .  Refer to the original H&P for additional details.  The current plan of care is to f/u on RUQ [place].  Ultrasound reviewed showing concern for early acute cholecystitis.  Seems to correlate with patient's history and exam.  Discussed the case with Dr. Sheliah Hatch with general surgery.  Have placed temporary admit orders at his request.   Discussed patient's case with General Surgery to request admission. Patient and family (if present) updated with plan. Care transferred to Surgery service.  I reviewed all nursing notes, vitals, pertinent old records, EKGs, labs, imaging (as available).     Maia Plan, MD 09/19/21 0030

## 2021-09-18 NOTE — ED Notes (Signed)
US at bedside

## 2021-09-18 NOTE — ED Provider Notes (Signed)
[facility] EMERGENCY DEPT Provider Note   CSN: [id number] Arrival date & time: 09/18/21  1832     History Chief Complaint  Patient presents with   Abdominal Pain   Back Pain    Madeline Freeman is a 20 y.o. female.  HPI    For the last 2 weeks having episodes of severe pain in stomach and back Happened 4 times in the last 2 weeks, associated with nausea and vomiting Today on the way home from work, pain severe, had to start vomiting, doubled over, crying Phenergan and tramadol PTA This time felt like going to pass out, hx of scoliosis, has had some back issues in the past but never like this Upper abdomen to back, in between shoulder blades, epigastric area Doesn't normally get sick but with these episodes is Today ate at 1PM and pain didn't come on until 5PM Today emesis was red but not sure if from eating or not, had marinara No diarrhea or constipation, no other vaginal bleeding or discharge Menses 2 days this time, not on OCPs No fever Ibuprofen as needed, not regularly, drinking on the weekends sometimes Will be severe, vomits a lot then starts to ease up This time pain rocking back and forth in pain No mj Family hx of cholelithiasis, mom had GB out at age 6 Pain was severe, now is mild but still present  Past Medical History:  Diagnosis Date   Anxiety    Depression     Patient Active Problem List   Diagnosis Date Noted   Dyspareunia in female 09/20/2019   Depo-Provera contraceptive status 11/30/2018   Scoliosis 11/30/2018    Past Surgical History:  Procedure Laterality Date   WISDOM TOOTH EXTRACTION  2018     OB History     Gravida  0   Para  0   Term  0   Preterm  0   AB  0   Living  0      SAB  0   IAB  0   Ectopic  0   Multiple  0   Live Births  0           Family History  Problem Relation Age of Onset   Endometriosis Mother    Bipolar disorder Mother    Anxiety disorder Mother    Depression Mother     Cancer Maternal Grandmother    Depression Maternal Grandmother    Anxiety disorder Father    Depression Father    Alcohol abuse Father    Depression Sister    Anxiety disorder Sister    Sexual abuse Sister     Social History   Tobacco Use   Smoking status: Never   Smokeless tobacco: Former  Building services engineer Use: Every day  Substance Use Topics   Alcohol use: Yes   Drug use: No    Home Medications Prior to Admission medications   Medication Sig Start Date End Date Taking? Authorizing Provider  cetirizine-pseudoephedrine (ZYRTEC-D) 5-120 MG tablet Take 1 tablet by mouth daily. Patient not taking: Reported on 02/29/2020 10/22/19   Durward Parcel, FNP  Cholecalciferol (VITAMIN D-3) 125 MCG (5000 UT) TABS Take 1 tablet by mouth daily. Patient not taking: Reported on 09/20/2019 12/07/18   Hilts, Casimiro Needle, MD  cyclobenzaprine (FLEXERIL) 10 MG tablet Take 1 tablet (10 mg total) by mouth 3 (three) times daily as needed for muscle spasms. Patient not taking: Reported on 06/29/2021 03/10/20   Glyn Ade, Clydie Braun  E, PA-C  fluticasone (FLONASE) 50 MCG/ACT nasal spray Place 1 spray into both nostrils daily for 14 days. 10/22/19 11/05/19  Avegno, Zachery Dakins, FNP  ibuprofen (ADVIL) 800 MG tablet Take 1 tablet (800 mg total) by mouth every 8 (eight) hours as needed. Patient not taking: Reported on 06/29/2021 02/29/20   Hurshel Party, CNM  lamoTRIgine (LAMICTAL) 200 MG tablet Take 1 tablet (200 mg total) by mouth daily. Patient not taking: Reported on 06/29/2021 11/18/19   Arfeen, Phillips Grout, MD  medroxyPROGESTERone (DEPO-PROVERA) 150 MG/ML injection INJECT 1 ML INTO THE MUSCLE EVERY 3 MONTHS. Patient not taking: Reported on 06/29/2021 02/03/19   Brock Bad, MD  medroxyPROGESTERone (DEPO-PROVERA) 150 MG/ML injection Inject 1 mL (150 mg total) into the muscle every 3 (three) months. Patient not taking: Reported on 06/29/2021 10/20/19   Brock Bad, MD  medroxyPROGESTERone  (DEPO-PROVERA) 150 MG/ML injection Inject 1 mL (150 mg total) into the muscle every 3 (three) months. Patient not taking: Reported on 06/29/2021 02/29/20   Sharen Counter A, CNM  metoprolol tartrate (LOPRESSOR) 25 MG tablet Take 1 tablet (25 mg total) by mouth daily. Patient not taking: Reported on 06/29/2021 03/10/20   Glyn Ade, Scot Jun, PA-C  metroNIDAZOLE (FLAGYL) 500 MG tablet Take 1 tablet (500 mg total) by mouth 2 (two) times daily. Patient not taking: Reported on 06/29/2021 04/15/21   Constant, Peggy, MD  nitrofurantoin, macrocrystal-monohydrate, (MACROBID) 100 MG capsule Take 1 capsule (100 mg total) by mouth 2 (two) times daily. 06/29/21   Brock Bad, MD  promethazine (PHENERGAN) 12.5 MG tablet Take 1 tablet (12.5 mg total) by mouth every 6 (six) hours as needed for nausea or vomiting. Patient not taking: Reported on 06/29/2021 03/10/20   Glyn Ade, Scot Jun, PA-C  sertraline (ZOLOFT) 100 MG tablet Take 1 tablet (100 mg total) by mouth daily. 11/18/19 11/17/20  Arfeen, Phillips Grout, MD  SUMAtriptan (IMITREX) 100 MG tablet Take 1 tablet (100 mg total) by mouth once as needed for up to 1 dose for migraine. May repeat in 2 hours if headache persists or recurs. Patient not taking: Reported on 06/29/2021 03/10/20   Glyn Ade, Scot Jun, PA-C  tiZANidine (ZANAFLEX) 2 MG tablet Take 1-2 tablets (2-4 mg total) by mouth at bedtime as needed for muscle spasms. Patient not taking: No sig reported 12/07/18   Hilts, Casimiro Needle, MD    Allergies    Patient has no known allergies.  Review of Systems   Review of Systems  Constitutional:  Negative for fever.  Eyes:  Negative for visual disturbance.  Respiratory:  Negative for cough and shortness of breath.   Cardiovascular:  Negative for chest pain.  Gastrointestinal:  Positive for abdominal pain, nausea and vomiting. Negative for constipation and diarrhea.  Genitourinary:  Negative for dysuria.  Musculoskeletal:  Positive for back pain.  Skin:   Negative for rash.  Neurological:  Positive for light-headedness. Negative for headaches.   Physical Exam Updated Vital Signs BP 116/73   Pulse 83   Temp 98.2 F (36.8 C)   Resp 18   Ht 5' (1.524 m)   Wt 58.1 kg   SpO2 100%   BMI 25.00 kg/m   Physical Exam Vitals and nursing note reviewed.  Constitutional:      General: She is not in acute distress.    Appearance: She is well-developed. She is not diaphoretic.  HENT:     Head: Normocephalic and atraumatic.  Eyes:     Conjunctiva/sclera: Conjunctivae normal.  Cardiovascular:     Rate and Rhythm: Normal rate and regular rhythm.     Heart sounds: Normal heart sounds. No murmur heard.   No friction rub. No gallop.  Pulmonary:     Effort: Pulmonary effort is normal. No respiratory distress.     Breath sounds: Normal breath sounds. No wheezing or rales.  Abdominal:     General: There is no distension.     Palpations: Abdomen is soft.     Tenderness: There is abdominal tenderness in the right upper quadrant, right lower quadrant and epigastric area. There is no guarding. Negative signs include Murphy's sign.  Musculoskeletal:        General: No tenderness.     Cervical back: Normal range of motion.  Skin:    General: Skin is warm and dry.     Findings: No erythema or rash.  Neurological:     Mental Status: She is alert and oriented to person, place, and time.    ED Results / Procedures / Treatments   Labs (all labs ordered are listed, but only abnormal results are displayed) Labs Reviewed  LIPASE, BLOOD - Abnormal; Notable for the following components:      Result Value   Lipase 54 (*)    All other components within normal limits  COMPREHENSIVE METABOLIC PANEL - Abnormal; Notable for the following components:   Glucose, Bld 120 (*)    ALT 48 (*)    All other components within normal limits  CBC - Abnormal; Notable for the following components:   WBC 13.3 (*)    Platelets 417 (*)    All other components within  normal limits  URINALYSIS, ROUTINE W REFLEX MICROSCOPIC - Abnormal; Notable for the following components:   Hgb urine dipstick TRACE (*)    Ketones, ur 15 (*)    Protein, ur TRACE (*)    Bacteria, UA RARE (*)    All other components within normal limits  PREGNANCY, URINE    EKG None  Radiology CT ABDOMEN PELVIS W CONTRAST  Result Date: 09/18/2021 CLINICAL DATA:  Acute abdominal pain. EXAM: CT ABDOMEN AND PELVIS WITH CONTRAST TECHNIQUE: Multidetector CT imaging of the abdomen and pelvis was performed using the standard protocol following bolus administration of intravenous contrast. CONTRAST:  68mL OMNIPAQUE IOHEXOL 300 MG/ML  SOLN COMPARISON:  None. FINDINGS: Lower chest: No acute abnormality. Hepatobiliary: No focal liver abnormality is seen. No gallstones, gallbladder wall thickening, or biliary dilatation. Pancreas: Unremarkable. No pancreatic ductal dilatation or surrounding inflammatory changes. Spleen: Normal in size without focal abnormality. Adrenals/Urinary Tract: Adrenal glands are unremarkable. Kidneys are normal, without renal calculi, focal lesion, or hydronephrosis. Bladder is unremarkable. Stomach/Bowel: Stomach is within normal limits. Appendix appears normal. No evidence of bowel wall thickening, distention, or inflammatory changes. Vascular/Lymphatic: No significant vascular findings are present. No enlarged abdominal or pelvic lymph nodes. Reproductive: Uterus and bilateral adnexa are unremarkable. Other: No abdominal wall hernia or abnormality. No abdominopelvic ascites. Musculoskeletal: No acute or significant osseous findings. IMPRESSION: No acute localizing process in the abdomen or pelvis. Electronically Signed   By: Darliss Cheney M.D.   On: 09/18/2021 22:40   US Abdomen Limited RUQ (LIVER/GB)  Result Date: 09/18/2021 CLINICAL DATA:  Right upper quadrant pain and projectile vomiting, initial encounter EXAM: ULTRASOUND ABDOMEN LIMITED RIGHT UPPER QUADRANT COMPARISON:   None. FINDINGS: Gallbladder: Gallbladder is well distended with multiple gallstones within. Positive sonographic Murphy's sign is noted with mildly prominent wall at 3.5 mm. Common bile duct: Diameter: 6.6  mm slightly prominent for the patient's given age although no significant dilatation of the common duct was noted on recent CT. Liver: No focal lesion identified. Within normal limits in parenchymal echogenicity. Portal vein is patent on color Doppler imaging with normal direction of blood flow towards the liver. Other: None. IMPRESSION: Cholelithiasis with findings suspicious for early acute cholecystitis. HIDA scan may be helpful for further clarification. Mildly prominent common bile duct although appeared within normal limits on CT scan from 1 hour previous. Correlate with laboratory values. Electronically Signed   By: Alcide Clever M.D.   On: 09/18/2021 23:50    Procedures Procedures   Medications Ordered in ED Medications  sodium chloride 0.9 % bolus 1,000 mL (0 mLs Intravenous Stopped 09/18/21 2337)  iohexol (OMNIPAQUE) 300 MG/ML solution 100 mL (75 mLs Intravenous Contrast Given 09/18/21 2219)    ED Course  I have reviewed the triage vital signs and the nursing notes.  Pertinent labs & imaging results that were available during my care of the patient were reviewed by me and considered in my medical decision making (see chart for details).    MDM Rules/Calculators/A&P                            20 year old female with no significant medical history presents with concern for 2 weeks of intermittent severe pain in the epigastrium with radiation to the back and associated nausea vomiting.  DDx includes appendicitis, pancreatitis, cholecystitis, pyelonephritis, nephrolithiasis, diverticulitis, choledocholithiasis, peptic ulcer disease.  Given she also had some right lower quadrant tenderness on exam, CT abdomen pelvis was ordered to evaluate for signs of appendicitis, and also look for  other abnormalities such as cholecystitis.  Lipase is mildly elevated, but not consistent with pancreatitis.    CT returned showing no evidence of acute abnormalities.  Given concern for symptomatic cholelithiasis, right upper ultrasound was ordered.  Care was signed out to Dr. Jacqulyn Bath with ultrasound pending.  Final Clinical Impression(s) / ED Diagnoses Final diagnoses:  Abdominal pain  Epigastric abdominal pain  Nausea and vomiting, unspecified vomiting type    Rx / DC Orders ED Discharge Orders     None        Alvira Monday, MD 09/19/21 0015

## 2021-09-19 ENCOUNTER — Inpatient Hospital Stay (HOSPITAL_COMMUNITY): Payer: Medicaid Other | Admitting: Anesthesiology

## 2021-09-19 ENCOUNTER — Encounter (HOSPITAL_COMMUNITY): Admission: EM | Disposition: A | Discharge status: Home / Self Care | Payer: Self-pay | Source: Home / Self Care

## 2021-09-19 ENCOUNTER — Encounter (HOSPITAL_COMMUNITY): Payer: Self-pay

## 2021-09-19 ENCOUNTER — Inpatient Hospital Stay (HOSPITAL_COMMUNITY): Payer: Medicaid Other

## 2021-09-19 DIAGNOSIS — Z9049 Acquired absence of other specified parts of digestive tract: Secondary | ICD-10-CM | POA: Diagnosis not present

## 2021-09-19 DIAGNOSIS — Z20822 Contact with and (suspected) exposure to covid-19: Secondary | ICD-10-CM | POA: Diagnosis not present

## 2021-09-19 DIAGNOSIS — M419 Scoliosis, unspecified: Secondary | ICD-10-CM | POA: Diagnosis not present

## 2021-09-19 DIAGNOSIS — K810 Acute cholecystitis: Secondary | ICD-10-CM | POA: Diagnosis not present

## 2021-09-19 DIAGNOSIS — K8000 Calculus of gallbladder with acute cholecystitis without obstruction: Secondary | ICD-10-CM | POA: Diagnosis present

## 2021-09-19 DIAGNOSIS — N941 Unspecified dyspareunia: Secondary | ICD-10-CM | POA: Diagnosis not present

## 2021-09-19 DIAGNOSIS — K812 Acute cholecystitis with chronic cholecystitis: Secondary | ICD-10-CM | POA: Diagnosis not present

## 2021-09-19 DIAGNOSIS — F418 Other specified anxiety disorders: Secondary | ICD-10-CM | POA: Diagnosis not present

## 2021-09-19 DIAGNOSIS — Z79899 Other long term (current) drug therapy: Secondary | ICD-10-CM | POA: Diagnosis not present

## 2021-09-19 HISTORY — PX: CHOLECYSTECTOMY: SHX55

## 2021-09-19 LAB — COMPREHENSIVE METABOLIC PANEL
ALT: 43 U/L (ref 0–44)
AST: 26 U/L (ref 15–41)
Albumin: 3.2 g/dL — ABNORMAL LOW (ref 3.5–5.0)
Alkaline Phosphatase: 51 U/L (ref 38–126)
Anion gap: 7 (ref 5–15)
BUN: 9 mg/dL (ref 6–20)
CO2: 24 mmol/L (ref 22–32)
Calcium: 8.7 mg/dL — ABNORMAL LOW (ref 8.9–10.3)
Chloride: 108 mmol/L (ref 98–111)
Creatinine, Ser: 0.89 mg/dL (ref 0.44–1.00)
GFR, Estimated: >60 mL/min (ref >60)
Glucose, Bld: 98 mg/dL (ref 70–99)
Potassium: 3.7 mmol/L (ref 3.5–5.1)
Sodium: 139 mmol/L (ref 135–145)
Total Bilirubin: 0.2 mg/dL — ABNORMAL LOW (ref 0.3–1.2)
Total Protein: 5.8 g/dL — ABNORMAL LOW (ref 6.5–8.1)

## 2021-09-19 LAB — RESP PANEL BY RT-PCR (FLU A&B, COVID) ARPGX2
Influenza A by PCR: NEGATIVE
Influenza B by PCR: NEGATIVE
SARS Coronavirus 2 by RT PCR: NEGATIVE

## 2021-09-19 LAB — CBC
HCT: 37.9 % (ref 36.0–46.0)
Hemoglobin: 12.7 g/dL (ref 12.0–15.0)
MCH: 29.5 pg (ref 26.0–34.0)
MCHC: 33.5 g/dL (ref 30.0–36.0)
MCV: 88.1 fL (ref 80.0–100.0)
Platelets: 344 10*3/uL (ref 150–400)
RBC: 4.3 MIL/uL (ref 3.87–5.11)
RDW: 13.9 % (ref 11.5–15.5)
WBC: 8.6 10*3/uL (ref 4.0–10.5)
nRBC: 0 % (ref 0.0–0.2)

## 2021-09-19 LAB — HIV ANTIBODY (ROUTINE TESTING W REFLEX): HIV Screen 4th Generation wRfx: NONREACTIVE

## 2021-09-19 SURGERY — LAPAROSCOPIC CHOLECYSTECTOMY WITH INTRAOPERATIVE CHOLANGIOGRAM
Anesthesia: General | Site: Abdomen

## 2021-09-19 MED ORDER — OXYCODONE HCL 5 MG PO TABS
ORAL_TABLET | ORAL | Status: AC
  Filled 2021-09-19: qty 1

## 2021-09-19 MED ORDER — PROPOFOL 10 MG/ML IV BOLUS
INTRAVENOUS | Status: AC
  Filled 2021-09-19: qty 20

## 2021-09-19 MED ORDER — OXYCODONE HCL 5 MG PO TABS
5.0000 mg | ORAL_TABLET | Freq: Four times a day (QID) | ORAL | Status: DC | PRN
Start: 2021-09-19 — End: 2021-09-19

## 2021-09-19 MED ORDER — FENTANYL CITRATE (PF) 100 MCG/2ML IJ SOLN: 25.0000 ug | INTRAMUSCULAR | Status: DC | PRN

## 2021-09-19 MED ORDER — LACTATED RINGERS IV SOLN: INTRAVENOUS | Status: DC

## 2021-09-19 MED ORDER — SCOPOLAMINE 1 MG/3DAYS TD PT72
MEDICATED_PATCH | TRANSDERMAL | Status: AC
  Filled 2021-09-19: qty 1

## 2021-09-19 MED ORDER — ONDANSETRON HCL 4 MG/2ML IJ SOLN
INTRAMUSCULAR | Status: DC | PRN
  Administered 2021-09-19: 4 mg via INTRAVENOUS

## 2021-09-19 MED ORDER — POLYETHYLENE GLYCOL 3350 17 G PO PACK: 17.0000 g | PACK | Freq: Every day | ORAL | Status: DC | PRN

## 2021-09-19 MED ORDER — CHLORHEXIDINE GLUCONATE 0.12 % MT SOLN
OROMUCOSAL | Status: AC
  Administered 2021-09-19: 15 mL via OROMUCOSAL
  Filled 2021-09-19: qty 15

## 2021-09-19 MED ORDER — SUGAMMADEX SODIUM 200 MG/2ML IV SOLN
INTRAVENOUS | Status: DC | PRN
  Administered 2021-09-19: 200 mg via INTRAVENOUS

## 2021-09-19 MED ORDER — BUPIVACAINE-EPINEPHRINE (PF) 0.25% -1:200000 IJ SOLN
INTRAMUSCULAR | Status: AC
  Filled 2021-09-19: qty 30

## 2021-09-19 MED ORDER — MORPHINE SULFATE (PF) 4 MG/ML IV SOLN: 4.0000 mg | INTRAVENOUS | Status: DC | PRN

## 2021-09-19 MED ORDER — SODIUM CHLORIDE 0.9 % IV SOLN
2.0000 g | Freq: Every day | INTRAVENOUS | Status: DC
  Administered 2021-09-19: 2 g via INTRAVENOUS
  Filled 2021-09-19 (×2): qty 20

## 2021-09-19 MED ORDER — MORPHINE SULFATE (PF) 2 MG/ML IV SOLN: 2.0000 mg | INTRAVENOUS | Status: DC | PRN

## 2021-09-19 MED ORDER — ACETAMINOPHEN 10 MG/ML IV SOLN
1000.0000 mg | Freq: Once | INTRAVENOUS | Status: DC | PRN
  Administered 2021-09-19: 1000 mg via INTRAVENOUS

## 2021-09-19 MED ORDER — KCL IN DEXTROSE-NACL 20-5-0.45 MEQ/L-%-% IV SOLN
INTRAVENOUS | Status: DC
  Filled 2021-09-19: qty 1000

## 2021-09-19 MED ORDER — DEXMEDETOMIDINE (PRECEDEX) IN NS 20 MCG/5ML (4 MCG/ML) IV SYRINGE
PREFILLED_SYRINGE | INTRAVENOUS | Status: AC
  Filled 2021-09-19: qty 5

## 2021-09-19 MED ORDER — ORAL CARE MOUTH RINSE: 15.0000 mL | Freq: Once | OROMUCOSAL | Status: AC

## 2021-09-19 MED ORDER — ROCURONIUM BROMIDE 10 MG/ML (PF) SYRINGE
PREFILLED_SYRINGE | INTRAVENOUS | Status: AC
  Filled 2021-09-19: qty 10

## 2021-09-19 MED ORDER — SODIUM CHLORIDE 0.9 % IR SOLN
Status: DC | PRN
  Administered 2021-09-19: 1000 mL

## 2021-09-19 MED ORDER — SCOPOLAMINE 1 MG/3DAYS TD PT72
MEDICATED_PATCH | TRANSDERMAL | Status: DC | PRN
  Administered 2021-09-19: 1 via TRANSDERMAL

## 2021-09-19 MED ORDER — ROCURONIUM BROMIDE 10 MG/ML (PF) SYRINGE
PREFILLED_SYRINGE | INTRAVENOUS | Status: DC | PRN
  Administered 2021-09-19: 40 mg via INTRAVENOUS

## 2021-09-19 MED ORDER — IOHEXOL 300 MG/ML  SOLN
INTRAMUSCULAR | Status: DC | PRN
  Administered 2021-09-19: 15 mL

## 2021-09-19 MED ORDER — ENOXAPARIN SODIUM 40 MG/0.4ML IJ SOSY: 40.0000 mg | PREFILLED_SYRINGE | INTRAMUSCULAR | Status: DC

## 2021-09-19 MED ORDER — MIDAZOLAM HCL 2 MG/2ML IJ SOLN
INTRAMUSCULAR | Status: AC
  Filled 2021-09-19: qty 2

## 2021-09-19 MED ORDER — METOPROLOL TARTRATE 5 MG/5ML IV SOLN: 5.0000 mg | Freq: Four times a day (QID) | INTRAVENOUS | Status: DC | PRN

## 2021-09-19 MED ORDER — ACETAMINOPHEN 10 MG/ML IV SOLN
INTRAVENOUS | Status: AC
  Filled 2021-09-19: qty 100

## 2021-09-19 MED ORDER — ACETAMINOPHEN 325 MG PO TABS: 650.0000 mg | ORAL_TABLET | Freq: Four times a day (QID) | ORAL | Status: DC | PRN

## 2021-09-19 MED ORDER — OXYCODONE HCL 5 MG/5ML PO SOLN: 5.0000 mg | Freq: Once | ORAL | Status: DC | PRN

## 2021-09-19 MED ORDER — BUPIVACAINE-EPINEPHRINE (PF) 0.25% -1:200000 IJ SOLN
INTRAMUSCULAR | Status: DC | PRN
  Administered 2021-09-19: 30 mL

## 2021-09-19 MED ORDER — ONDANSETRON 4 MG PO TBDP: 4.0000 mg | ORAL_TABLET | Freq: Four times a day (QID) | ORAL | Status: DC | PRN

## 2021-09-19 MED ORDER — CHLORHEXIDINE GLUCONATE 0.12 % MT SOLN: 15.0000 mL | Freq: Once | OROMUCOSAL | Status: AC

## 2021-09-19 MED ORDER — MIDAZOLAM HCL 5 MG/5ML IJ SOLN
INTRAMUSCULAR | Status: DC | PRN
  Administered 2021-09-19: 2 mg via INTRAVENOUS

## 2021-09-19 MED ORDER — KETOROLAC TROMETHAMINE 30 MG/ML IJ SOLN: 30.0000 mg | Freq: Once | INTRAMUSCULAR | Status: DC

## 2021-09-19 MED ORDER — KETOROLAC TROMETHAMINE 30 MG/ML IJ SOLN
INTRAMUSCULAR | Status: DC | PRN
  Administered 2021-09-19: 30 mg via INTRAVENOUS

## 2021-09-19 MED ORDER — 0.9 % SODIUM CHLORIDE (POUR BTL) OPTIME
TOPICAL | Status: DC | PRN
  Administered 2021-09-19: 1000 mL

## 2021-09-19 MED ORDER — ROCURONIUM BROMIDE 100 MG/10ML IV SOLN: INTRAVENOUS | Status: DC | PRN

## 2021-09-19 MED ORDER — AMISULPRIDE (ANTIEMETIC) 5 MG/2ML IV SOLN: 10.0000 mg | Freq: Once | INTRAVENOUS | Status: DC | PRN

## 2021-09-19 MED ORDER — OXYCODONE-ACETAMINOPHEN 5-325 MG PO TABS: 1.0000 | ORAL_TABLET | ORAL | 0 refills | Status: DC | PRN

## 2021-09-19 MED ORDER — DEXAMETHASONE SODIUM PHOSPHATE 10 MG/ML IJ SOLN
INTRAMUSCULAR | Status: DC | PRN
  Administered 2021-09-19: 4 mg via INTRAVENOUS

## 2021-09-19 MED ORDER — FENTANYL CITRATE (PF) 250 MCG/5ML IJ SOLN
INTRAMUSCULAR | Status: DC | PRN
  Administered 2021-09-19: 100 ug via INTRAVENOUS
  Administered 2021-09-19: 50 ug via INTRAVENOUS
  Administered 2021-09-19: 150 ug via INTRAVENOUS

## 2021-09-19 MED ORDER — LIDOCAINE 2% (20 MG/ML) 5 ML SYRINGE
INTRAMUSCULAR | Status: DC | PRN
  Administered 2021-09-19: 60 mg via INTRAVENOUS

## 2021-09-19 MED ORDER — PROPOFOL 10 MG/ML IV BOLUS
INTRAVENOUS | Status: DC | PRN
  Administered 2021-09-19: 100 mg via INTRAVENOUS

## 2021-09-19 MED ORDER — PROMETHAZINE HCL 25 MG/ML IJ SOLN: 6.2500 mg | INTRAMUSCULAR | Status: DC | PRN

## 2021-09-19 MED ORDER — DEXMEDETOMIDINE (PRECEDEX) IN NS 20 MCG/5ML (4 MCG/ML) IV SYRINGE
PREFILLED_SYRINGE | INTRAVENOUS | Status: DC | PRN
  Administered 2021-09-19: 20 ug via INTRAVENOUS

## 2021-09-19 MED ORDER — OXYCODONE HCL 5 MG PO TABS: 5.0000 mg | ORAL_TABLET | Freq: Once | ORAL | Status: DC | PRN

## 2021-09-19 MED ORDER — ONDANSETRON HCL 4 MG/2ML IJ SOLN
INTRAMUSCULAR | Status: AC
  Filled 2021-09-19: qty 2

## 2021-09-19 MED ORDER — MUPIROCIN 2 % EX OINT: 1.0000 "application " | TOPICAL_OINTMENT | Freq: Two times a day (BID) | CUTANEOUS | Status: DC

## 2021-09-19 MED ORDER — KETOROLAC TROMETHAMINE 30 MG/ML IJ SOLN
INTRAMUSCULAR | Status: AC
  Filled 2021-09-19: qty 1

## 2021-09-19 MED ORDER — LIDOCAINE 2% (20 MG/ML) 5 ML SYRINGE
INTRAMUSCULAR | Status: AC
  Filled 2021-09-19: qty 5

## 2021-09-19 MED ORDER — ACETAMINOPHEN 650 MG RE SUPP: 650.0000 mg | Freq: Four times a day (QID) | RECTAL | Status: DC | PRN

## 2021-09-19 MED ORDER — FENTANYL CITRATE (PF) 250 MCG/5ML IJ SOLN
INTRAMUSCULAR | Status: AC
  Filled 2021-09-19: qty 5

## 2021-09-19 MED ORDER — LIDOCAINE HCL (CARDIAC) PF 100 MG/5ML IV SOSY: PREFILLED_SYRINGE | INTRAVENOUS | Status: DC | PRN

## 2021-09-19 MED ORDER — DEXAMETHASONE SODIUM PHOSPHATE 10 MG/ML IJ SOLN
INTRAMUSCULAR | Status: AC
  Filled 2021-09-19: qty 1

## 2021-09-19 MED ORDER — ONDANSETRON HCL 4 MG/2ML IJ SOLN: 4.0000 mg | Freq: Four times a day (QID) | INTRAMUSCULAR | Status: DC | PRN

## 2021-09-19 SURGICAL SUPPLY — 48 items
APPLIER CLIP 5 13 M/L LIGAMAX5 (MISCELLANEOUS) ×2
BAG COUNTER SPONGE SURGICOUNT (BAG) ×2 IMPLANT
CANISTER SUCT 3000ML PPV (MISCELLANEOUS) ×2 IMPLANT
CATH URET 5FR 28IN OPEN ENDED (CATHETERS) ×2 IMPLANT
CHLORAPREP W/TINT 26 (MISCELLANEOUS) ×2 IMPLANT
CLIP APPLIE 5 13 M/L LIGAMAX5 (MISCELLANEOUS) ×1 IMPLANT
COVER MAYO STAND STRL (DRAPES) ×2 IMPLANT
COVER SURGICAL LIGHT HANDLE (MISCELLANEOUS) ×2 IMPLANT
DERMABOND ADVANCED (GAUZE/BANDAGES/DRESSINGS) ×1
DERMABOND ADVANCED .7 DNX12 (GAUZE/BANDAGES/DRESSINGS) ×1 IMPLANT
DRAPE C-ARM 42X120 X-RAY (DRAPES) ×2 IMPLANT
ELECT REM PT RETURN 9FT ADLT (ELECTROSURGICAL) ×2
ELECTRODE REM PT RTRN 9FT ADLT (ELECTROSURGICAL) ×1 IMPLANT
ENDOLOOP SUT PDS II  0 18 (SUTURE)
ENDOLOOP SUT PDS II 0 18 (SUTURE) IMPLANT
GLOVE SRG 8 PF TXTR STRL LF DI (GLOVE) ×1 IMPLANT
GLOVE SURG ENC MOIS LTX SZ7.5 (GLOVE) ×2 IMPLANT
GLOVE SURG UNDER POLY LF SZ8 (GLOVE) ×1
GOWN STRL REUS W/ TWL LRG LVL3 (GOWN DISPOSABLE) ×2 IMPLANT
GOWN STRL REUS W/ TWL XL LVL3 (GOWN DISPOSABLE) ×1 IMPLANT
GOWN STRL REUS W/TWL LRG LVL3 (GOWN DISPOSABLE) ×2
GOWN STRL REUS W/TWL XL LVL3 (GOWN DISPOSABLE) ×1
GRASPER SUT TROCAR 14GX15 (MISCELLANEOUS) ×2 IMPLANT
IRRIG SUCT STRYKERFLOW 2 WTIP (MISCELLANEOUS) ×2
IRRIGATION SUCT STRKRFLW 2 WTP (MISCELLANEOUS) ×1 IMPLANT
IV CATH 14GX2 1/4 (CATHETERS) ×2 IMPLANT
IV CATH AUTO 14GX1.75 SAFE ORG (IV SOLUTION) ×2 IMPLANT
KIT BASIN OR (CUSTOM PROCEDURE TRAY) ×2 IMPLANT
KIT TURNOVER KIT B (KITS) ×2 IMPLANT
NEEDLE INSUFFLATION 14GA 120MM (NEEDLE) ×2 IMPLANT
NS IRRIG 1000ML POUR BTL (IV SOLUTION) ×2 IMPLANT
PAD ARMBOARD 7.5X6 YLW CONV (MISCELLANEOUS) ×2 IMPLANT
POUCH RETRIEVAL ECOSAC 10 (ENDOMECHANICALS) ×1 IMPLANT
POUCH RETRIEVAL ECOSAC 10MM (ENDOMECHANICALS) ×1
SCISSORS LAP 5X35 DISP (ENDOMECHANICALS) ×2 IMPLANT
SET CHOLANGIOGRAPH 5 50 .035 (SET/KITS/TRAYS/PACK) ×2 IMPLANT
SET TUBE SMOKE EVAC HIGH FLOW (TUBING) ×2 IMPLANT
SLEEVE ENDOPATH XCEL 5M (ENDOMECHANICALS) ×4 IMPLANT
SPECIMEN JAR SMALL (MISCELLANEOUS) ×2 IMPLANT
STOPCOCK 4 WAY LG BORE MALE ST (IV SETS) ×2 IMPLANT
SUT MNCRL AB 4-0 PS2 18 (SUTURE) ×2 IMPLANT
TOWEL GREEN STERILE (TOWEL DISPOSABLE) ×2 IMPLANT
TOWEL GREEN STERILE FF (TOWEL DISPOSABLE) ×2 IMPLANT
TRAY LAPAROSCOPIC MC (CUSTOM PROCEDURE TRAY) ×2 IMPLANT
TROCAR XCEL NON-BLD 11X100MML (ENDOMECHANICALS) ×2 IMPLANT
TROCAR XCEL NON-BLD 5MMX100MML (ENDOMECHANICALS) ×2 IMPLANT
WARMER LAPAROSCOPE (MISCELLANEOUS) ×2 IMPLANT
WATER STERILE IRR 1000ML POUR (IV SOLUTION) ×2 IMPLANT

## 2021-09-19 NOTE — Progress Notes (Signed)
Patient with cholecystitis.  Slight increase in CBD size.  I recommended laparoscopic cholecystectomy.  The procedure itself as well as its risks, benefits and alternatives were discussed and the patient granted consent to proceed.  Will proceed today as scheduled.

## 2021-09-19 NOTE — Anesthesia Preprocedure Evaluation (Signed)
Anesthesia Evaluation  Patient identified by MRN, date of birth, ID band Patient awake    Reviewed: Allergy & Precautions, NPO status , Patient's Chart, lab work & pertinent test results  Airway Mallampati: II  TM Distance: >3 FB Neck ROM: Full    Dental no notable dental hx.    Pulmonary neg pulmonary ROS,    Pulmonary exam normal breath sounds clear to auscultation       Cardiovascular negative cardio ROS Normal cardiovascular exam Rhythm:Regular Rate:Normal     Neuro/Psych PSYCHIATRIC DISORDERS Anxiety Depression negative neurological ROS     GI/Hepatic negative GI ROS, Neg liver ROS,   Endo/Other  negative endocrine ROS  Renal/GU negative Renal ROS     Musculoskeletal negative musculoskeletal ROS (+)   Abdominal   Peds  Hematology negative hematology ROS (+)   Anesthesia Other Findings ACUTE CHOLECYSTITIS  Reproductive/Obstetrics hcg negative                             Anesthesia Physical Anesthesia Plan  ASA: 2  Anesthesia Plan: General   Post-op Pain Management:    Induction: Intravenous  PONV Risk Score and Plan: 4 or greater and Ondansetron, Dexamethasone, Midazolam, Scopolamine patch - Pre-op and Treatment may vary due to age or medical condition  Airway Management Planned: Oral ETT  Additional Equipment:   Intra-op Plan:   Post-operative Plan: Extubation in OR  Informed Consent: I have reviewed the patients History and Physical, chart, labs and discussed the procedure including the risks, benefits and alternatives for the proposed anesthesia with the patient or authorized representative who has indicated his/her understanding and acceptance.     Dental advisory given  Plan Discussed with: CRNA  Anesthesia Plan Comments:         Anesthesia Quick Evaluation

## 2021-09-19 NOTE — Anesthesia Postprocedure Evaluation (Signed)
Anesthesia Post Note  Patient: Madeline Freeman  Procedure(s) Performed: LAPAROSCOPIC CHOLECYSTECTOMY WITH INTRAOPERATIVE CHOLANGIOGRAM (Abdomen)     Patient location during evaluation: PACU Anesthesia Type: General Level of consciousness: awake Pain management: pain level controlled Vital Signs Assessment: post-procedure vital signs reviewed and stable Respiratory status: spontaneous breathing, nonlabored ventilation, respiratory function stable and patient connected to nasal cannula oxygen Cardiovascular status: blood pressure returned to baseline and stable Postop Assessment: no apparent nausea or vomiting Anesthetic complications: no   No notable events documented.  Last Vitals:  Vitals:   09/19/21 1536 09/19/21 1551  BP: 107/75 109/71  Pulse: 62 69  Resp: 12 12  Temp:  36.6 C  SpO2: 99% 100%    Last Pain:  Vitals:   09/19/21 1551  TempSrc:   PainSc: 0-No pain                 Luka Reisch P Dariann Huckaba

## 2021-09-19 NOTE — Discharge Instructions (Signed)
[facility] ______[facility] °LAPAROSCOPIC SURGERY: POST OP INSTRUCTIONS °Always review your discharge instruction sheet given to you by the facility where your surgery was performed. °IF YOU HAVE DISABILITY OR FAMILY LEAVE FORMS, YOU MUST BRING THEM TO THE OFFICE FOR PROCESSING.   °DO NOT GIVE THEM TO YOUR DOCTOR. ° °A prescription for pain medication may be given to you upon discharge.  Take your pain medication as prescribed, if needed.  If narcotic pain medicine is not needed, then you may take acetaminophen (Tylenol) or ibuprofen (Advil) as needed. °Take your usually prescribed medications unless otherwise directed. °If you need a refill on your pain medication, please contact your pharmacy.  They will contact our office to request authorization. Prescriptions will not be filled after 5pm or on week-ends. °You should follow a light diet the first few days after arrival home, such as soup and crackers, etc.  Be sure to include lots of fluids daily. °Most patients will experience some swelling and bruising in the area of the incisions.  Ice packs will help.  Swelling and bruising can take several days to resolve.  °It is common to experience some constipation if taking pain medication after surgery.  Increasing fluid intake and taking a stool softener (such as Colace) will usually help or prevent this problem from occurring.  A mild laxative (Milk of Magnesia or Miralax) should be taken according to package instructions if there are no bowel movements after 48 hours. °Unless discharge instructions indicate otherwise, you may remove your bandages 24-48 hours after surgery, and you may shower at that time.  You may have steri-strips (small skin tapes) in place directly over the incision.  These strips should be left on the skin for 7-10 days.  If your surgeon used skin glue on the incision, you may shower in 24 hours.  The glue will flake off over the next 2-3 weeks.  Any sutures or staples will be  removed at the office during your follow-up visit. °ACTIVITIES:  You may resume regular (light) daily activities beginning the next day--such as daily self-care, walking, climbing stairs--gradually increasing activities as tolerated.  You may have sexual intercourse when it is comfortable.  Refrain from any heavy lifting or straining until approved by your doctor. °You may drive when you are no longer taking prescription pain medication, you can comfortably wear a seatbelt, and you can safely maneuver your car and apply brakes. °RETURN TO WORK:  __________________________________________________________ °You should see your doctor in the office for a follow-up appointment approximately 2-3 weeks after your surgery.  Make sure that you call for this appointment within a day or two after you arrive home to insure a convenient appointment time. °OTHER INSTRUCTIONS: __________________________________________________________________________________________________________________________ __________________________________________________________________________________________________________________________ °WHEN TO CALL YOUR DOCTOR: °Fever over 101.0 °Inability to urinate °Continued bleeding from incision. °Increased pain, redness, or drainage from the incision. °Increasing abdominal pain ° °The clinic staff is available to answer your questions during regular business hours.  Please don’t hesitate to call and ask to speak to one of the nurses for clinical concerns.  If you have a medical emergency, go to the nearest emergency room or call 911.  A surgeon from [facility] is always on call at the hospital. °[address], [address], [place], [place]  [address] ? [address] [address], [place],    [address] °[phone] ? [phone] ? FAX [phone] °Web site: [email or web address] ° ° ° °Managing Your Pain After Surgery Without Opioids ° ° ° °Thank you for   participating in our program to  help patients manage their pain after surgery without opioids. This is part of our effort to provide you with the best care possible, without exposing you or your family to the risk that opioids pose. ° °What pain can I expect after surgery? °You can expect to have some pain after surgery. This is normal. The pain is typically worse the day after surgery, and quickly begins to get better. °Many studies have found that many patients are able to manage their pain after surgery with Over-the-Counter (OTC) medications such as Tylenol and Motrin. If you have a condition that does not allow you to take Tylenol or Motrin, notify your surgical team. ° °How will I manage my pain? °The best strategy for controlling your pain after surgery is around the clock pain control with Tylenol (acetaminophen) and Motrin (ibuprofen or Advil). Alternating these medications with each other allows you to maximize your pain control. In addition to Tylenol and Motrin, you can use heating pads or ice packs on your incisions to help reduce your pain. ° °How will I alternate your regular strength over-the-counter pain medication? °You will take a dose of pain medication every three hours. °Start by taking 650 mg of Tylenol (2 pills of 325 mg) °3 hours later take 600 mg of Motrin (3 pills of 200 mg) °3 hours after taking the Motrin take 650 mg of Tylenol °3 hours after that take 600 mg of Motrin. ° ° °- 1 - ° °See example - if your first dose of Tylenol is at 12:00 PM ° ° °12:00 PM Tylenol 650 mg (2 pills of 325 mg)  °3:00 PM Motrin 600 mg (3 pills of 200 mg)  °6:00 PM Tylenol 650 mg (2 pills of 325 mg)  °9:00 PM Motrin 600 mg (3 pills of 200 mg)  °Continue alternating every 3 hours  ° °We recommend that you follow this schedule around-the-clock for at least 3 days after surgery, or until you feel that it is no longer needed. Use the table on the last page of this handout to keep track of the medications you are taking. °Important: °Do not take  more than 3000mg of Tylenol or 3200mg of Motrin in a 24-hour period. °Do not take ibuprofen/Motrin if you have a history of bleeding stomach ulcers, severe kidney disease, &/or actively taking a blood thinner ° °What if I still have pain? °If you have pain that is not controlled with the over-the-counter pain medications (Tylenol and Motrin or Advil) you might have what we call “breakthrough” pain. You will receive a prescription for a small amount of an opioid pain medication such as Oxycodone, Tramadol, or Tylenol with Codeine. Use these opioid pills in the first 24 hours after surgery if you have breakthrough pain. Do not take more than 1 pill every 4-6 hours. ° °If you still have uncontrolled pain after using all opioid pills, don't hesitate to call our staff using the number provided. We will help make sure you are managing your pain in the best way possible, and if necessary, we can provide a prescription for additional pain medication. ° ° °Day 1   ° °Time  °Name of Medication Number of pills taken  °Amount of Acetaminophen  °Pain Level  ° °Comments  °AM PM       °AM PM       °AM PM       °AM PM       °AM PM       °  AM PM       °AM PM       °AM PM       °Total Daily amount of Acetaminophen °Do not take more than  3,000 mg per day    ° ° °Day 2   ° °Time  °Name of Medication Number of pills °taken  °Amount of Acetaminophen  °Pain Level  ° °Comments  °AM PM       °AM PM       °AM PM       °AM PM       °AM PM       °AM PM       °AM PM       °AM PM       °Total Daily amount of Acetaminophen °Do not take more than  3,000 mg per day    ° ° °Day 3   ° °Time  °Name of Medication Number of pills taken  °Amount of Acetaminophen  °Pain Level  ° °Comments  °AM PM       °AM PM       °AM PM       °AM PM       ° ° ° °AM PM       °AM PM       °AM PM       °AM PM       °Total Daily amount of Acetaminophen °Do not take more than  3,000 mg per day    ° ° °Day 4   ° °Time  °Name of Medication Number of pills taken  °Amount of  Acetaminophen  °Pain Level  ° °Comments  °AM PM       °AM PM       °AM PM       °AM PM       °AM PM       °AM PM       °AM PM       °AM PM       °Total Daily amount of Acetaminophen °Do not take more than  3,000 mg per day    ° ° °Day 5   ° °Time  °Name of Medication Number °of pills taken  °Amount of Acetaminophen  °Pain Level  ° °Comments  °AM PM       °AM PM       °AM PM       °AM PM       °AM PM       °AM PM       °AM PM       °AM PM       °Total Daily amount of Acetaminophen °Do not take more than  3,000 mg per day    ° ° ° °Day 6   ° °Time  °Name of Medication Number of pills °taken  °Amount of Acetaminophen  °Pain Level  °Comments  °AM PM       °AM PM       °AM PM       °AM PM       °AM PM       °AM PM       °AM PM       °AM PM       °Total Daily amount of Acetaminophen °Do not take more than  3,000 mg per day    ° ° °Day 7   ° °Time  °  Name of Medication Number of pills taken  °Amount of Acetaminophen  °Pain Level  ° °Comments  °AM PM       °AM PM       °AM PM       °AM PM       °AM PM       °AM PM       °AM PM       °AM PM       °Total Daily amount of Acetaminophen °Do not take more than  3,000 mg per day    ° ° ° ° °For additional information about how and where to safely dispose of unused opioid °medications - https://www.morepowerfulnc.org ° °Disclaimer: This document contains information and/or instructional materials adapted from [facility] for the typical patient with your condition. It does not replace medical advice from your health care provider because your experience may differ from that of the °typical patient. Talk to your health care provider if you have any questions about this °document, your condition or your treatment plan. °Adapted from [facility] ° °

## 2021-09-19 NOTE — ED Notes (Signed)
Report given to [facility]. 

## 2021-09-19 NOTE — H&P (Signed)
Reason for Consult:abdominal pain Referring Provider: Cathi Roan  Gearldine Looney is an 20 y.o. female.  HPI: 20 yo female with 2 week history of intermittent abdominal pain. It is epigastric. It comes on a hour after heavy meals. It is associated with nausea and vomiting. It radiates to the back. She has had an attack 5 times this month.  Past Medical History:  Diagnosis Date   Anxiety    Depression     Past Surgical History:  Procedure Laterality Date   WISDOM TOOTH EXTRACTION  2018    Family History  Problem Relation Age of Onset   Endometriosis Mother    Bipolar disorder Mother    Anxiety disorder Mother    Depression Mother    Cancer Maternal Grandmother    Depression Maternal Grandmother    Anxiety disorder Father    Depression Father    Alcohol abuse Father    Depression Sister    Anxiety disorder Sister    Sexual abuse Sister     Social History:  reports that she has never smoked. She has quit using smokeless tobacco. She reports current alcohol use. She reports that she does not use drugs.  Allergies: No Known Allergies  Medications: I have reviewed the patient's current medications.  Results for orders placed or performed during the hospital encounter of 09/18/21 (from the past 48 hour(s))  Lipase, blood     Status: Abnormal   Collection Time: 09/18/21  6:45 PM  Result Value Ref Range   Lipase 54 (H) 11 - 51 U/L    Comment: Performed at [facility], [address] [place], [place] [address]  Comprehensive metabolic panel     Status: Abnormal   Collection Time: 09/18/21  6:45 PM  Result Value Ref Range   Sodium 140 135 - 145 mmol/L   Potassium 3.9 3.5 - 5.1 mmol/L   Chloride 103 98 - 111 mmol/L   CO2 27 22 - 32 mmol/L   Glucose, Bld 120 (H) 70 - 99 mg/dL    Comment: Glucose reference range applies only to samples taken after fasting for at least 8 hours.   BUN 11 6 - 20 mg/dL   Creatinine, Ser 7.61 0.44 - 1.00 mg/dL   Calcium 95.0  8.9 - 93.2 mg/dL   Total Protein 7.6 6.5 - 8.1 g/dL   Albumin 4.8 3.5 - 5.0 g/dL   AST 41 15 - 41 U/L   ALT 48 (H) 0 - 44 U/L   Alkaline Phosphatase 59 38 - 126 U/L   Total Bilirubin 0.4 0.3 - 1.2 mg/dL   GFR, Estimated >67 >12 mL/min    Comment: (NOTE) Calculated using the CKD-EPI Creatinine Equation (2021)    Anion gap 10 5 - 15    Comment: Performed at [facility], [address] [place], [place] [address]  CBC     Status: Abnormal   Collection Time: 09/18/21  6:45 PM  Result Value Ref Range   WBC 13.3 (H) 4.0 - 10.5 K/uL   RBC 4.93 3.87 - 5.11 MIL/uL   Hemoglobin 14.4 12.0 - 15.0 g/dL   HCT 98.3 38.2 - 50.5 %   MCV 86.0 80.0 - 100.0 fL   MCH 29.2 26.0 - 34.0 pg   MCHC 34.0 30.0 - 36.0 g/dL   RDW 39.7 67.3 - 41.9 %   Platelets 417 (H) 150 - 400 K/uL   nRBC 0.0 0.0 - 0.2 %    Comment: Performed at [facility], [address] [place], [place] [address]  Urinalysis, Routine w reflex microscopic     Status: Abnormal   Collection Time: 09/18/21  6:52 PM  Result Value Ref Range   Color, Urine YELLOW YELLOW   APPearance CLEAR CLEAR   Specific Gravity, Urine 1.024 1.005 - 1.030   pH 6.5 5.0 - 8.0   Glucose, UA NEGATIVE NEGATIVE mg/dL   Hgb urine dipstick TRACE (A) NEGATIVE   Bilirubin Urine NEGATIVE NEGATIVE   Ketones, ur 15 (A) NEGATIVE mg/dL   Protein, ur TRACE (A) NEGATIVE mg/dL   Nitrite NEGATIVE NEGATIVE   Leukocytes,Ua NEGATIVE NEGATIVE   RBC / HPF 0-5 0 - 5 RBC/hpf   WBC, UA 0-5 0 - 5 WBC/hpf   Bacteria, UA RARE (A) NONE SEEN   Squamous Epithelial / LPF 0-5 0 - 5   Mucus PRESENT    Hyaline Casts, UA PRESENT    Amorphous Crystal PRESENT     Comment: Performed at [facility], [address] [place], [place], [place] [address]  Pregnancy, urine     Status: None   Collection Time: 09/18/21  6:52 PM  Result Value Ref Range   Preg Test, Ur NEGATIVE NEGATIVE    Comment:        THE SENSITIVITY OF  THIS METHODOLOGY IS >20 mIU/mL. Performed at [facility], [address] [place], [place] [address]   Resp Panel by RT-PCR (Flu A&B, Covid) Nasopharyngeal Swab     Status: None   Collection Time: 09/19/21 12:49 AM   Specimen: Nasopharyngeal Swab; Nasopharyngeal(NP) swabs in vial transport medium  Result Value Ref Range   SARS Coronavirus 2 by RT PCR NEGATIVE NEGATIVE    Comment: (NOTE) SARS-CoV-2 target nucleic acids are NOT DETECTED.  The SARS-CoV-2 RNA is generally detectable in upper respiratory specimens during the acute phase of infection. The lowest concentration of SARS-CoV-2 viral copies this assay can detect is 138 copies/mL. A negative result does not preclude SARS-Cov-2 infection and should not be used as the sole basis for treatment or other patient management decisions. A negative result may occur with  improper specimen collection/handling, submission of specimen other than nasopharyngeal swab, presence of viral mutation(s) within the areas targeted by this assay, and inadequate number of viral copies(<138 copies/mL). A negative result must be combined with clinical observations, patient history, and epidemiological information. The expected result is Negative.  Fact Sheet for Patients:  BloggerCourse.com  Fact Sheet for Healthcare Providers:  SeriousBroker.it  This test is no t yet approved or cleared by the [place] FDA and  has been authorized for detection and/or diagnosis of SARS-CoV-2 by FDA under an Emergency Use Authorization (EUA). This EUA will remain  in effect (meaning this test can be used) for the duration of the COVID-19 declaration under Section 564(b)(1) of the Act, 21 U.S.C.section 360bbb-3(b)(1), unless the authorization is terminated  or revoked sooner.       Influenza A by PCR NEGATIVE NEGATIVE   Influenza B by PCR NEGATIVE NEGATIVE    Comment: (NOTE) The Xpert  Xpress SARS-CoV-2/FLU/RSV plus assay is intended as an aid in the diagnosis of influenza from Nasopharyngeal swab specimens and should not be used as a sole basis for treatment. Nasal washings and aspirates are unacceptable for Xpert Xpress SARS-CoV-2/FLU/RSV testing.  Fact Sheet for Patients: BloggerCourse.com  Fact Sheet for Healthcare Providers: SeriousBroker.it  This test is not yet approved or cleared by the [place] FDA and has been authorized for detection and/or diagnosis  of SARS-CoV-2 by FDA under an Emergency Use Authorization (EUA). This EUA will remain in effect (meaning this test can be used) for the duration of the COVID-19 declaration under Section 564(b)(1) of the Act, 21 U.S.C. section 360bbb-3(b)(1), unless the authorization is terminated or revoked.  Performed at [facility], [address] [place], [place] [address]     CT ABDOMEN PELVIS W CONTRAST  Result Date: 09/18/2021 CLINICAL DATA:  Acute abdominal pain. EXAM: CT ABDOMEN AND PELVIS WITH CONTRAST TECHNIQUE: Multidetector CT imaging of the abdomen and pelvis was performed using the standard protocol following bolus administration of intravenous contrast. CONTRAST:  15mL OMNIPAQUE IOHEXOL 300 MG/ML  SOLN COMPARISON:  None. FINDINGS: Lower chest: No acute abnormality. Hepatobiliary: No focal liver abnormality is seen. No gallstones, gallbladder wall thickening, or biliary dilatation. Pancreas: Unremarkable. No pancreatic ductal dilatation or surrounding inflammatory changes. Spleen: Normal in size without focal abnormality. Adrenals/Urinary Tract: Adrenal glands are unremarkable. Kidneys are normal, without renal calculi, focal lesion, or hydronephrosis. Bladder is unremarkable. Stomach/Bowel: Stomach is within normal limits. Appendix appears normal. No evidence of bowel wall thickening, distention, or inflammatory changes.  Vascular/Lymphatic: No significant vascular findings are present. No enlarged abdominal or pelvic lymph nodes. Reproductive: Uterus and bilateral adnexa are unremarkable. Other: No abdominal wall hernia or abnormality. No abdominopelvic ascites. Musculoskeletal: No acute or significant osseous findings. IMPRESSION: No acute localizing process in the abdomen or pelvis. Electronically Signed   By: Darliss Cheney M.D.   On: 09/18/2021 22:40   US Abdomen Limited RUQ (LIVER/GB)  Result Date: 09/18/2021 CLINICAL DATA:  Right upper quadrant pain and projectile vomiting, initial encounter EXAM: ULTRASOUND ABDOMEN LIMITED RIGHT UPPER QUADRANT COMPARISON:  None. FINDINGS: Gallbladder: Gallbladder is well distended with multiple gallstones within. Positive sonographic Murphy's sign is noted with mildly prominent wall at 3.5 mm. Common bile duct: Diameter: 6.6 mm slightly prominent for the patient's given age although no significant dilatation of the common duct was noted on recent CT. Liver: No focal lesion identified. Within normal limits in parenchymal echogenicity. Portal vein is patent on color Doppler imaging with normal direction of blood flow towards the liver. Other: None. IMPRESSION: Cholelithiasis with findings suspicious for early acute cholecystitis. HIDA scan may be helpful for further clarification. Mildly prominent common bile duct although appeared within normal limits on CT scan from 1 hour previous. Correlate with laboratory values. Electronically Signed   By: Alcide Clever M.D.   On: 09/18/2021 23:50    Review of Systems  Constitutional: Negative.   HENT: Negative.    Eyes: Negative.   Respiratory: Negative.    Cardiovascular: Negative.   Gastrointestinal:  Positive for abdominal pain, nausea and vomiting. Negative for diarrhea.  Genitourinary: Negative.   Musculoskeletal: Negative.   Skin: Negative.   Neurological: Negative.   Endo/Heme/Allergies: Negative.   Psychiatric/Behavioral:  Negative.     PE Blood pressure 114/73, pulse (!) 110, temperature 98.2 F (36.8 C), temperature source Oral, resp. rate 18, height 5' (1.524 m), weight 58.1 kg, SpO2 99 %. Constitutional: NAD; conversant; no deformities Eyes: Moist conjunctiva; no lid lag; anicteric; PERRL Neck: Trachea midline; no thyromegaly Lungs: Normal respiratory effort; no tactile fremitus CV: RRR; no palpable thrills; no pitting edema GI: Abd tenderness in the epigastrium and right upper quadrant; no palpable hepatosplenomegaly MSK: Normal gait; no clubbing/cyanosis Psychiatric: Appropriate affect; alert and oriented x3 Lymphatic: No palpable cervical or axillary lymphadenopathy Skin: No major subcutaneous nodules. Warm and dry   Assessment/Plan: 20 yo female with epigastric abdominal  pain, findings of gallstones, wall thickening, slightly dilated CBD at 6 mm, LFT elevation and leukocytosis. -admit to surgery -IV abx -lap chole w ioc later today -We discussed the etiology of her pain, we discussed treatment options and recommended surgery. We discussed details of surgery including general anesthesia, laparoscopic approach, identification of cystic duct and common bile duct. Ligation of cystic duct and cystic artery. Possible need for intraoperative cholangiogram or open procedure. Possible risks of common bile duct injury, liver injury, cystic duct leak, bleeding, infection, post-cholecystectomy syndrome. The patient showed good understanding and all questions were answered   De Blanch Dulse Rutan 09/19/2021, 5:29 AM

## 2021-09-19 NOTE — Op Note (Addendum)
Patient: Madeline Freeman ([birth year], [id number])  Date of Surgery: 09/19/21  Preoperative Diagnosis: ACUTE CHOLECYSTITIS   Postoperative Diagnosis: ACUTE CHOLECYSTITIS   Surgical Procedure: LAPAROSCOPIC CHOLECYSTECTOMY WITH INTRAOPERATIVE CHOLANGIOGRAM: [id number]   Operative Team Members:  Surgeon(s) and Role:    * Nicolena Schurman, Hyman Hopes, MD - Primary   Tresa Moore, MD - [facility] Resident Assistant  Anesthesiologist: Leonides Grills, MD CRNA: Drema Pry, CRNA; Adria Dill, CRNA   Anesthesia: General   Fluids:  Total I/O In: 1000 [I.V.:1000] Out: -   Complications: None  Drains:  none   Specimen:  ID Type Source Tests Collected by Time Destination  1 : Gallbladder Tissue PATH Gallbladder SURGICAL PATHOLOGY Hisao Doo, Hyman Hopes, MD 09/19/2021 1333      Disposition:  PACU - hemodynamically stable.  Plan of Care: Discharge to home after PACU    Indications for Procedure: Madeline Freeman is a 20 y.o. female who presented with abdominal pain.  History, physical and imaging was concerning for cholecystitis.  Laparoscopic cholecystectomy was recommended for the patient.  The procedure itself, as well as the risks, benefits and alternatives were discussed with the patient.  Risks discussed included but were not limited to the risk of infection, bleeding, damage to nearby structures, need to convert to open procedure, incisional hernia, bile leak, common bile duct injury and the need for additional procedures or surgeries.  With this discussion complete and all questions answered the patient granted consent to proceed.  Findings: Inflamed gallbladder  Infection status: Patient: [name] Patient Case: Emergent Infection Present At Time Of Surgery (PATOS): Inflamed gallbladder with some spillage of bile during cholangiogram   Description of Procedure:   On the date stated above, the patient was taken to the operating room suite and placed in  supine positioning.  Sequential compression devices were placed on the lower extremities to prevent blood clots.  General endotracheal anesthesia was induced. Preoperative antibiotics were given within 30 minutes of incision.  The patient's abdomen was prepped and draped in the usual sterile fashion.  A time-out was completed verifying the correct patient, procedure, positioning and equipment needed for the case.  We began by anesthetizing the skin with local anesthetic and then making a 5 mm incision just below the umbilicus.  We dissected through the subcutaneous tissues to the fascia.  The fascia was grasped and elevated using a Kocher clamp.  A Veress needle was inserted into the abdomen and the abdomen was insufflated to 15 mmHg.  A 5 mm trocar was inserted in this position under optical guidance and then the abdomen was inspected.  There was no trauma to the underlying viscera with initial trocar placement.  Any abnormal findings, other than inflammation in the right upper quadrant, are listed above in the findings section.  Three additional trocars were placed, one 12 mm trocar in the subxiphoid position, one 5 mm trocar in the midline epigastric area and one 47mm trocar in the right upper quadrant subcostally.  These were placed under direct vision without any trauma to the underlying viscera.    The patient was then placed in head up, left side down positioning.  The gallbladder was identified and dissected free from its attachments to the omentum allowing the duodenum to fall away.  The infundibulum of the gallbladder was dissected free working laterally to medially.  The cystic duct and cystic artery were dissected free from surrounding connective tissue.  The infundibulum of the gallbladder was dissected off the  cystic plate.  A critical view of safety was obtained with the cystic duct and cystic artery being cleared of connective tissues and clearly the only two structures entering into the  gallbladder with the liver clearly visible behind.  One clip was applied high on the cystic duct.  A small ductotomy was created below this using the endoscopic shears.  A cholangiogram catheter was introduced through the abdominal wall and into the cystic duct through this ductotomy.  The catheter was clipped into position.  The catheter was flushed to ensure no leakage around the clip.  We then removed the laparoscopic instruments and positioned the C-Arm to perform a cholangiogram.  The catheter was flushed with contrast under fluoroscopic visualization and a cholangiogram was obtained.  The cholangiogram visualized the biliary tree from the ampulla up to the first two biliary radicals in the liver.  There were no filling defects identified.  The catheter clearly entered the cystic duct.  There was gradual tapering of the common bile duct down to the ampulla without evidence of stricture or other abnormalities.  Please see the EMR for saved representative images.  With our cholangiogram compete, we moved the c-arm away from the field and returned to laparoscopic surgery.    Radiologist notes some rounded filling defects at the distal bile duct on representative images.  On live fluoroscopy these defects appeared to be mobile, more likely gas bubble.  I recommend monitoring for any symptoms postop and considering ERCP to investigate further if she develops issues.  Clips were then applied to the cystic duct and cystic artery and then these structures were divided.  The gallbladder was dissected off the cystic plate, placed in an endocatch bag and removed from the 12 mm subxiphoid port site.  The clips were inspected and appeared effective.  The cystic plate was inspected and hemostasis was obtained using electrocautery.  A raytec was introduced into the abdomen and the operative field was blotted dry, then the raytec was removed.  Attention was turned to closure.  The 12 mm subxiphoid port site was closed  using a 0-vicryl suture on a fascial suture passer.  The abdomen was desufflated.  The skin was closed using 4-0 monocryl and dermabond.  All sponge and needle counts were correct at the conclusion of the case.    Ivar Drape, MD General, Bariatric, & Minimally Invasive Surgery [facility], [place]

## 2021-09-19 NOTE — Transfer of Care (Signed)
Immediate Anesthesia Transfer of Care Note  Patient: Madeline Freeman  Procedure(s) Performed: LAPAROSCOPIC CHOLECYSTECTOMY WITH INTRAOPERATIVE CHOLANGIOGRAM (Abdomen)  Patient Location: PACU  Anesthesia Type:General  Level of Consciousness: drowsy, patient cooperative and responds to stimulation  Airway & Oxygen Therapy: Patient Spontanous Breathing  Post-op Assessment: Report given to RN and Post -op Vital signs reviewed and stable  Post vital signs: Reviewed and stable  Last Vitals:  Vitals Value Taken Time  BP    Temp    Pulse    Resp    SpO2      Last Pain:  Vitals:   09/19/21 1117  TempSrc:   PainSc: 2          Complications: No notable events documented.

## 2021-09-19 NOTE — Anesthesia Procedure Notes (Signed)
Procedure Name: Intubation Date/Time: 09/19/2021 1:15 PM Performed by: Adria Dill, CRNA Pre-anesthesia Checklist: Patient identified, Emergency Drugs available, Suction available and Patient being monitored Patient Re-evaluated:Patient Re-evaluated prior to induction Oxygen Delivery Method: Circle system utilized Preoxygenation: Pre-oxygenation with 100% oxygen Induction Type: IV induction Ventilation: Mask ventilation without difficulty Laryngoscope Size: Miller and 3 Grade View: Grade I Tube type: Oral Tube size: 7.0 mm Number of attempts: 1 Airway Equipment and Method: Stylet and Oral airway Placement Confirmation: ETT inserted through vocal cords under direct vision, positive ETCO2 and breath sounds checked- equal and bilateral Secured at: 21 cm Tube secured with: Tape Dental Injury: Teeth and Oropharynx as per pre-operative assessment

## 2021-09-19 NOTE — ED Notes (Signed)
Report called to RN Marlyn on 6N at [facility]

## 2021-09-20 ENCOUNTER — Encounter (HOSPITAL_COMMUNITY): Payer: Self-pay | Admitting: Surgery

## 2021-09-20 NOTE — Discharge Summary (Signed)
  Patient ID: Madeline Freeman [id number] 20 y.o. [birth year]  09/18/2021  Discharge date and time: 09/20/2021  Admitting Physician: Hyman Hopes Theon Sobotka  Discharge Physician: Hyman Hopes Maysun Meditz  Admission Diagnoses: Acute cholecystitis [K81.0] Epigastric abdominal pain [R10.13] Abdominal pain [R10.9] Nausea and vomiting, unspecified vomiting type [R11.2] Cholecystitis, acute with cholelithiasis [K80.00] Patient Active Problem List   Diagnosis Date Noted   Acute cholecystitis 09/19/2021   Cholecystitis, acute with cholelithiasis 09/19/2021   Dyspareunia in female 09/20/2019   Depo-Provera contraceptive status 11/30/2018   Scoliosis 11/30/2018     Discharge Diagnoses: cholecystitis Patient Active Problem List   Diagnosis Date Noted   Acute cholecystitis 09/19/2021   Cholecystitis, acute with cholelithiasis 09/19/2021   Dyspareunia in female 09/20/2019   Depo-Provera contraceptive status 11/30/2018   Scoliosis 11/30/2018    Operations: Procedure(s): LAPAROSCOPIC CHOLECYSTECTOMY WITH INTRAOPERATIVE CHOLANGIOGRAM  Admission Condition: good  Discharged Condition: good  Indication for Admission: Cholecystitis  Hospital Course: [name] presented with abdominal pain found to have cholecystitis.  She underwent laparoscopic cholecystectomy with introperative cholangiogram.  She had very mobile filling defects on review of cholangiogram, possibly air bubbles.  Recommend she return for ERCP if symptoms develop.  Otherwise she did well and was discharged home.  Consults: None  Significant Diagnostic Studies: Imaging, IOC  Treatments: surgery: as above  Disposition: Home  Patient Instructions:  Allergies as of 09/19/2021   No Known Allergies      Medication List     STOP taking these medications    cetirizine-pseudoephedrine 5-120 MG tablet Commonly known as: ZYRTEC-D   cyclobenzaprine 10 MG tablet Commonly known as: FLEXERIL   lamoTRIgine 200 MG  tablet Commonly known as: LAMICTAL   medroxyPROGESTERone 150 MG/ML injection Commonly known as: DEPO-PROVERA   metoprolol tartrate 25 MG tablet Commonly known as: LOPRESSOR   metroNIDAZOLE 500 MG tablet Commonly known as: Flagyl   promethazine 12.5 MG tablet Commonly known as: PHENERGAN   tiZANidine 2 MG tablet Commonly known as: ZANAFLEX   Vitamin D-3 125 MCG (5000 UT) Tabs       TAKE these medications    fluticasone 50 MCG/ACT nasal spray Commonly known as: FLONASE Place 1 spray into both nostrils daily for 14 days.   ibuprofen 800 MG tablet Commonly known as: ADVIL Take 1 tablet (800 mg total) by mouth every 8 (eight) hours as needed.   nitrofurantoin (macrocrystal-monohydrate) 100 MG capsule Commonly known as: MACROBID Take 1 capsule (100 mg total) by mouth 2 (two) times daily.   oxyCODONE-acetaminophen 5-325 MG tablet Commonly known as: Percocet Take 1 tablet by mouth every 4 (four) hours as needed for severe pain.   sertraline 100 MG tablet Commonly known as: Zoloft Take 1 tablet (100 mg total) by mouth daily.   SUMAtriptan 100 MG tablet Commonly known as: IMITREX Take 1 tablet (100 mg total) by mouth once as needed for up to 1 dose for migraine. May repeat in 2 hours if headache persists or recurs.        Activity: no heavy lifting for 4 weeks Diet: regular diet Wound Care: keep wound clean and dry  Follow-up:  With Dr. Dossie Der in 4 weeks.  Signed: Hyman Hopes Blake Vetrano General, Bariatric, & Minimally Invasive Surgery [facility], [place]   09/20/2021, 9:34 AM

## 2021-09-21 LAB — NASOPHARYNGEAL CULTURE: Culture: NORMAL

## 2021-09-21 LAB — SURGICAL PATHOLOGY

## 2021-10-23 ENCOUNTER — Other Ambulatory Visit: Payer: Self-pay

## 2021-10-23 ENCOUNTER — Ambulatory Visit (INDEPENDENT_AMBULATORY_CARE_PROVIDER_SITE_OTHER): Payer: Medicaid Other | Admitting: *Deleted

## 2021-10-23 VITALS — BP 107/77 | HR 90

## 2021-10-23 DIAGNOSIS — R300 Dysuria: Secondary | ICD-10-CM | POA: Diagnosis not present

## 2021-10-23 LAB — POCT URINALYSIS DIPSTICK
Glucose, UA: NEGATIVE
Leukocytes, UA: NEGATIVE
Nitrite, UA: POSITIVE
Protein, UA: POSITIVE — AB
Spec Grav, UA: 1.025 (ref 1.010–1.025)
Urobilinogen, UA: 0.2 E.U./dL
pH, UA: 5 (ref 5.0–8.0)

## 2021-10-23 MED ORDER — PHENAZOPYRIDINE HCL 200 MG PO TABS: 200.0000 mg | ORAL_TABLET | Freq: Three times a day (TID) | ORAL | 1 refills | Status: DC | PRN

## 2021-10-23 MED ORDER — NITROFURANTOIN MONOHYD MACRO 100 MG PO CAPS: 100.0000 mg | ORAL_CAPSULE | Freq: Two times a day (BID) | ORAL | 0 refills | Status: DC

## 2021-10-23 NOTE — Progress Notes (Signed)
SUBJECTIVE: Madeline Freeman is a 20 y.o. female who complains of urinary frequency, urgency and dysuria x 8 days, without flank pain, fever, chills, or abnormal vaginal discharge or bleeding.   OBJECTIVE: Appears well, in no apparent distress.  Vital signs are normal. Urine dipstick shows positive for RBC's, positive for protein, positive for nitrates, and positive for ketones.    ASSESSMENT: Dysuria  PLAN: Treatment per orders.  Call or return to clinic prn if these symptoms worsen or fail to improve as anticipated.

## 2021-11-01 LAB — URINE CULTURE

## 2021-11-21 ENCOUNTER — Ambulatory Visit (INDEPENDENT_AMBULATORY_CARE_PROVIDER_SITE_OTHER): Payer: Medicaid Other

## 2021-11-21 ENCOUNTER — Other Ambulatory Visit (HOSPITAL_COMMUNITY)
Admission: RE | Admit: 2021-11-21 | Discharge: 2021-11-21 | Disposition: A | Discharge status: Home / Self Care | Payer: Medicaid Other | Source: Ambulatory Visit | Attending: Obstetrics and Gynecology | Admitting: Obstetrics and Gynecology

## 2021-11-21 ENCOUNTER — Other Ambulatory Visit: Payer: Self-pay

## 2021-11-21 VITALS — BP 115/71 | HR 63 | Ht 60.0 in | Wt 124.0 lb

## 2021-11-21 DIAGNOSIS — N898 Other specified noninflammatory disorders of vagina: Secondary | ICD-10-CM | POA: Insufficient documentation

## 2021-11-21 NOTE — Progress Notes (Signed)
SUBJECTIVE:  20 y.o. female complains of white vaginal discharge for 3 day(s). Denies abnormal vaginal bleeding or significant pelvic pain or fever. No UTI symptoms. Denies history of known exposure to STD.  Patient's last menstrual period was 11/01/2021 (exact date).  OBJECTIVE:  She appears well, afebrile. Urine dipstick: not done.  ASSESSMENT:  Vaginal Discharge    PLAN:  GC, chlamydia, trichomonas, BVAG, CVAG probe sent to lab. Treatment: To be determined once lab results are received ROV prn if symptoms persist or worsen.

## 2021-11-22 LAB — CERVICOVAGINAL ANCILLARY ONLY
Bacterial Vaginitis (gardnerella): POSITIVE — AB
Candida Glabrata: NEGATIVE
Candida Vaginitis: POSITIVE — AB
Chlamydia: NEGATIVE
Comment: NEGATIVE
Comment: NEGATIVE
Comment: NEGATIVE
Comment: NEGATIVE
Comment: NEGATIVE
Comment: NORMAL
Neisseria Gonorrhea: NEGATIVE
Trichomonas: NEGATIVE

## 2021-11-23 ENCOUNTER — Other Ambulatory Visit: Payer: Self-pay | Admitting: *Deleted

## 2021-11-23 MED ORDER — FLUCONAZOLE 150 MG PO TABS: 150.0000 mg | ORAL_TABLET | Freq: Once | ORAL | 0 refills | Status: DC

## 2021-11-23 MED ORDER — METRONIDAZOLE 500 MG PO TABS: 500.0000 mg | ORAL_TABLET | Freq: Two times a day (BID) | ORAL | 0 refills | Status: DC

## 2021-11-23 NOTE — Progress Notes (Signed)
Flagyl and Diflucan sent for +BV/yeast See lab results

## 2021-12-11 ENCOUNTER — Other Ambulatory Visit (HOSPITAL_COMMUNITY)
Admission: RE | Admit: 2021-12-11 | Discharge: 2021-12-11 | Disposition: A | Discharge status: Home / Self Care | Payer: Medicaid Other | Source: Ambulatory Visit | Attending: Obstetrics and Gynecology | Admitting: Obstetrics and Gynecology

## 2021-12-11 ENCOUNTER — Encounter: Payer: Self-pay | Admitting: Obstetrics and Gynecology

## 2021-12-11 ENCOUNTER — Other Ambulatory Visit: Payer: Self-pay

## 2021-12-11 ENCOUNTER — Ambulatory Visit (INDEPENDENT_AMBULATORY_CARE_PROVIDER_SITE_OTHER): Payer: Medicaid Other | Admitting: Obstetrics and Gynecology

## 2021-12-11 VITALS — BP 117/76 | HR 97 | Ht 60.0 in | Wt 125.2 lb

## 2021-12-11 DIAGNOSIS — Z124 Encounter for screening for malignant neoplasm of cervix: Secondary | ICD-10-CM | POA: Diagnosis not present

## 2021-12-11 DIAGNOSIS — N926 Irregular menstruation, unspecified: Secondary | ICD-10-CM | POA: Diagnosis not present

## 2021-12-11 DIAGNOSIS — Z7251 High risk heterosexual behavior: Secondary | ICD-10-CM

## 2021-12-11 DIAGNOSIS — N9089 Other specified noninflammatory disorders of vulva and perineum: Secondary | ICD-10-CM

## 2021-12-11 LAB — POCT URINE PREGNANCY: Preg Test, Ur: NEGATIVE

## 2021-12-11 NOTE — Progress Notes (Signed)
GYNECOLOGY OFFICE VISIT NOTE  History:   Madeline Freeman is a 21 y.o. G0P0000 here today for her period and cramps. About 4-5 days prior to her period in January she was having intercourse when she had acute onset of pain during intercourse such that they had to stop and her pain continued for the course of the night. The pain was present in the morning but improved. She then had her period which was when it was due - it was more painful than usual for her but only lasted about 1.5 days when usually they last 5 days.   She does also note unprotected intercourse 2 weeks ago. UPT here was negative. She does not want anything for birth control. She has tried the pills, depo, IUD.   She has cramps with her period but nothing that she cannot manage. She does note family history of endometriosis in her mother. Mom had a hyst at 24 due to the endometriosis.   She had some vulvar itching yesterday but it resolved with changing her clothes and showering.  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      Past Medical History:  Diagnosis Date   Anxiety    Depression     Past Surgical History:  Procedure Laterality Date   CHOLECYSTECTOMY N/A 09/19/2021   Procedure: LAPAROSCOPIC CHOLECYSTECTOMY WITH INTRAOPERATIVE CHOLANGIOGRAM;  Surgeon: Quentin Ore, MD;  Location: [facility] OR;  Service: General;  Laterality: N/A;   WISDOM TOOTH EXTRACTION  2018    The following portions of the patient's history were reviewed and updated as appropriate: allergies, current medications, past family history, past medical history, past social history, past surgical history and problem list.   Health Maintenance:   No pap history   Review of Systems:  Pertinent items noted in HPI and remainder of comprehensive ROS otherwise negative.  Physical Exam:  BP 117/76 (BP Location: Left Arm, Patient Position: Sitting, Cuff Size: Normal)    Pulse 97    Ht 5' (1.524 m)    Wt 125 lb 3.2 oz (56.8 kg)    LMP  12/06/2021    BMI 24.45 kg/m  CONSTITUTIONAL: Well-developed, well-nourished female in no acute distress.  HEENT:  Normocephalic, atraumatic. External right and left ear normal. No scleral icterus.  NECK: Normal range of motion, supple, no masses noted on observation SKIN: No rash noted. Not diaphoretic. No erythema. No pallor. MUSCULOSKELETAL: Normal range of motion. No edema noted. NEUROLOGIC: Alert and oriented to person, place, and time. Normal muscle tone coordination. No cranial nerve deficit noted. PSYCHIATRIC: Normal mood and affect. Normal behavior. Normal judgment and thought content.  CARDIOVASCULAR: Normal heart rate noted RESPIRATORY: Effort and breath sounds normal, no problems with respiration noted ABDOMEN: No masses noted. No other overt distention noted.    PELVIC: Normal appearing external genitalia; normal urethral meatus; normal appearing vaginal mucosa and cervix.  No abnormal discharge noted.  Normal uterine size, no other palpable masses, no uterine or adnexal tenderness. Performed in the presence of a chaperone  Labs and Imaging Results for orders placed or performed in visit on 12/11/21 (from the past 168 hour(s))  POCT urine pregnancy   Collection Time: 12/11/21  4:23 PM  Result Value Ref Range   Preg Test, Ur Negative Negative   No results found.  Assessment and Plan:    1. Unprotected sex - UPT negative - She declines birth control  2. Menstrual problem - Discussed differential: i.e. secondary dysmenorrhea vs endometriosis vs  other etiology - Discussed only way to tell definitively is either by US showing an endometrioma or by laparoscopy. Would advise against surgery unless other treatment options have failed or concerns regarding infertility.  - Discussed treatment options: Motrin prn severe cramps along with tylenol, OCPs, Nuvaring, Depo, Nexplanon, Mirena and other hormonal options i.e. Pollie Friar. We discussed the risks and benefits of OCPs, including  long term benefits and risk of VTE with estrogen containing options. We discussed possible role of PFPT.  - We also discussed possible prevention of progression of endometriosis if this is the cause with use of OCPs, although not definitive.  - At this time, she would like to do: expectant management - If pain persists with intercourse we discussed PFPT.   3. Pap smear for cervical cancer screening - Pap done  4. Vulvar irritation - Recently treated for BV/Yeast and she took the therapy. GC/Ct recently negative.  - Reviewed vulvar hygiene and cleanser use over body washes   Routine preventative health maintenance measures emphasized. Please refer to After Visit Summary for other counseling recommendations.   Return in about 1 year (around 12/11/2022), or if symptoms worsen or fail to improve.  Milas Hock, MD, FACOG Obstetrician & Gynecologist, [facility] for [facility], [facility]

## 2021-12-13 LAB — CYTOLOGY - PAP: Diagnosis: NEGATIVE

## 2021-12-31 ENCOUNTER — Other Ambulatory Visit: Payer: Self-pay

## 2021-12-31 ENCOUNTER — Encounter: Payer: Self-pay | Admitting: Obstetrics and Gynecology

## 2021-12-31 ENCOUNTER — Other Ambulatory Visit: Payer: Self-pay | Admitting: Obstetrics and Gynecology

## 2021-12-31 DIAGNOSIS — B379 Candidiasis, unspecified: Secondary | ICD-10-CM

## 2021-12-31 MED ORDER — FLUCONAZOLE 150 MG PO TABS
150.0000 mg | ORAL_TABLET | Freq: Once | ORAL | 0 refills | Status: DC
Start: 2021-12-31 — End: 2021-12-31

## 2022-01-08 ENCOUNTER — Other Ambulatory Visit: Payer: Self-pay

## 2022-01-08 ENCOUNTER — Ambulatory Visit (INDEPENDENT_AMBULATORY_CARE_PROVIDER_SITE_OTHER): Payer: Medicaid Other | Admitting: Obstetrics

## 2022-01-08 ENCOUNTER — Other Ambulatory Visit (HOSPITAL_COMMUNITY)
Admission: RE | Admit: 2022-01-08 | Discharge: 2022-01-08 | Disposition: A | Discharge status: Home / Self Care | Payer: Medicaid Other | Source: Ambulatory Visit | Attending: Obstetrics | Admitting: Obstetrics

## 2022-01-08 ENCOUNTER — Encounter: Payer: Self-pay | Admitting: Obstetrics

## 2022-01-08 VITALS — BP 114/78 | HR 98 | Ht 60.0 in | Wt 122.7 lb

## 2022-01-08 DIAGNOSIS — N898 Other specified noninflammatory disorders of vagina: Secondary | ICD-10-CM

## 2022-01-08 DIAGNOSIS — Z3009 Encounter for other general counseling and advice on contraception: Secondary | ICD-10-CM | POA: Diagnosis not present

## 2022-01-08 MED ORDER — FLUCONAZOLE 200 MG PO TABS: 200.0000 mg | ORAL_TABLET | ORAL | 2 refills | Status: DC

## 2022-01-08 NOTE — Progress Notes (Signed)
Patient presents today with c/o vaginal swelling post intercourse. Is currently receiving tx for yeast infection. Sharlyne Pacas, RN

## 2022-01-08 NOTE — Progress Notes (Signed)
Patient ID: Madeline Freeman, female   DOB: [birth year], 21 y.o.   MRN: [id number]  Chief Complaint  Patient presents with   Gynecologic Exam    HPI Madeline Freeman is a 21 y.o. female.  Complains of vaginal swelling mainly after intercourse.  Recently treated for yeast infection.  Denies any significant pain. HPI  Past Medical History:  Diagnosis Date   Anxiety    Depression     Past Surgical History:  Procedure Laterality Date   CHOLECYSTECTOMY N/A 09/19/2021   Procedure: LAPAROSCOPIC CHOLECYSTECTOMY WITH INTRAOPERATIVE CHOLANGIOGRAM;  Surgeon: Quentin Ore, MD;  Location: [facility] OR;  Service: General;  Laterality: N/A;   WISDOM TOOTH EXTRACTION  2018    Family History  Problem Relation Age of Onset   Endometriosis Mother    Bipolar disorder Mother    Anxiety disorder Mother    Depression Mother    Cancer Maternal Grandmother    Depression Maternal Grandmother    Anxiety disorder Father    Depression Father    Alcohol abuse Father    Depression Sister    Anxiety disorder Sister    Sexual abuse Sister     Social History Social History   Tobacco Use   Smoking status: Never   Smokeless tobacco: Former  Building services engineer Use: Every day  Substance Use Topics   Alcohol use: Yes   Drug use: No    No Known Allergies  Current Outpatient Medications  Medication Sig Dispense Refill   fluconazole (DIFLUCAN) 200 MG tablet Take 1 tablet (200 mg total) by mouth every 3 (three) days. 3 tablet 2   fluconazole (DIFLUCAN) 150 MG tablet Take by mouth.     Current Facility-Administered Medications  Medication Dose Route Frequency Provider Last Rate Last Admin   medroxyPROGESTERone (DEPO-PROVERA) injection 150 mg  150 mg Intramuscular Q90 days Orvilla Cornwall A, CNM   150 mg at 08/23/20 1329    Review of Systems Review of Systems Constitutional: negative for fatigue and weight loss Respiratory: negative for cough and wheezing Cardiovascular: negative for chest  pain, fatigue and palpitations Gastrointestinal: negative for abdominal pain and change in bowel habits Genitourinary:positive for vaginal discharge Integument/breast: negative for nipple discharge Musculoskeletal:negative for myalgias Neurological: negative for gait problems and tremors Behavioral/Psych: negative for abusive relationship, depression Endocrine: negative for temperature intolerance      Blood pressure 114/78, pulse 98, height 5' (1.524 m), weight 122 lb 11.2 oz (55.7 kg).  Physical Exam Physical Exam General:   Alert and no distress  Skin:   no rash or abnormalities  Lungs:   clear to auscultation bilaterally  Heart:   regular rate and rhythm, S1, S2 normal, no murmur, click, rub or gallop  Breasts:   normal without suspicious masses, skin or nipple changes or axillary nodes  Abdomen:  normal findings: no organomegaly, soft, non-tender and no hernia  Pelvis:  External genitalia: normal general appearance Urinary system: urethral meatus normal and bladder without fullness, nontender Vaginal: normal without tenderness, induration or masses Cervix: normal appearance Adnexa: normal bimanual exam Uterus: anteverted and non-tender, normal size    I have spent a total of 20 minutes of face-to-face time, excluding clinical staff time, reviewing notes and preparing to see patient, ordering tests and/or medications, and counseling the patient.   Data Reviewed Wet Prep  Assessment     1. Vaginal irritation Rx: - fluconazole (DIFLUCAN) 200 MG tablet; Take 1 tablet (200 mg total) by mouth every 3 (three) days.  Dispense: 3 tablet; Refill: 2  2. Vaginal discharge Rx: - Cervicovaginal ancillary only( [place])  3. Encounter for other general counseling and advice on contraception - declines contraception     Plan   Follow up in 3 months   Meds ordered this encounter  Medications   fluconazole (DIFLUCAN) 200 MG tablet    Sig: Take 1 tablet (200 mg total) by  mouth every 3 (three) days.    Dispense:  3 tablet    Refill:  2      Brock Bad, MD 01/08/2022 3:25 PM

## 2022-01-10 ENCOUNTER — Encounter: Payer: Self-pay | Admitting: *Deleted

## 2022-01-10 ENCOUNTER — Other Ambulatory Visit: Payer: Self-pay | Admitting: Obstetrics

## 2022-01-10 DIAGNOSIS — B9689 Other specified bacterial agents as the cause of diseases classified elsewhere: Secondary | ICD-10-CM

## 2022-01-10 DIAGNOSIS — N760 Acute vaginitis: Secondary | ICD-10-CM

## 2022-01-10 LAB — CERVICOVAGINAL ANCILLARY ONLY
Bacterial Vaginitis (gardnerella): POSITIVE — AB
Candida Glabrata: NEGATIVE
Candida Vaginitis: POSITIVE — AB
Chlamydia: NEGATIVE
Comment: NEGATIVE
Comment: NEGATIVE
Comment: NEGATIVE
Comment: NEGATIVE
Comment: NEGATIVE
Comment: NORMAL
Neisseria Gonorrhea: NEGATIVE
Trichomonas: NEGATIVE

## 2022-01-10 MED ORDER — METRONIDAZOLE 500 MG PO TABS: 500.0000 mg | ORAL_TABLET | Freq: Two times a day (BID) | ORAL | 2 refills | Status: DC

## 2022-01-10 NOTE — Progress Notes (Signed)
MyChart message regarding BV, yeast and RXs sent. Education included.

## 2022-03-13 ENCOUNTER — Encounter: Payer: Self-pay | Admitting: Obstetrics and Gynecology

## 2022-03-13 ENCOUNTER — Ambulatory Visit: Payer: Medicaid Other | Admitting: Emergency Medicine

## 2022-03-13 ENCOUNTER — Other Ambulatory Visit (HOSPITAL_COMMUNITY)
Admission: RE | Admit: 2022-03-13 | Discharge: 2022-03-13 | Disposition: A | Discharge status: Home / Self Care | Payer: Medicaid Other | Source: Ambulatory Visit | Attending: Obstetrics and Gynecology | Admitting: Obstetrics and Gynecology

## 2022-03-13 VITALS — BP 116/77 | HR 103 | Ht 60.0 in | Wt 122.0 lb

## 2022-03-13 DIAGNOSIS — N898 Other specified noninflammatory disorders of vagina: Secondary | ICD-10-CM | POA: Diagnosis present

## 2022-03-13 NOTE — Progress Notes (Signed)
SUBJECTIVE:  ?21 y.o. female complains of white and thick vaginal discharge for 1 day(s). Symptoms have been recurrent since 12/22. ? ?Denies abnormal vaginal bleeding or significant pelvic pain or ?fever. No UTI symptoms. Denies history of known exposure to STD. ? ?Patient's last menstrual period was 02/23/2022 (exact date). ? ?OBJECTIVE:  ?She appears well, afebrile. ?Urine dipstick: not done. ? ?ASSESSMENT:  ?Vaginal Discharge - thick, white, curd like ?Vaginal Odor-none ? ? ?PLAN:  ?GC, chlamydia, trichomonas, BVAG, CVAG probe sent to lab. ?Treatment: To be determined once lab results are received ?ROV prn if symptoms persist or worsen.  ?

## 2022-03-15 LAB — CERVICOVAGINAL ANCILLARY ONLY
Bacterial Vaginitis (gardnerella): POSITIVE — AB
Candida Glabrata: NEGATIVE
Candida Vaginitis: POSITIVE — AB
Chlamydia: NEGATIVE
Comment: NEGATIVE
Comment: NEGATIVE
Comment: NEGATIVE
Comment: NEGATIVE
Comment: NEGATIVE
Comment: NORMAL
Neisseria Gonorrhea: NEGATIVE
Trichomonas: NEGATIVE

## 2022-03-18 ENCOUNTER — Encounter: Payer: Self-pay | Admitting: Advanced Practice Midwife

## 2022-03-18 ENCOUNTER — Ambulatory Visit (INDEPENDENT_AMBULATORY_CARE_PROVIDER_SITE_OTHER): Payer: Medicaid Other | Admitting: Advanced Practice Midwife

## 2022-03-18 VITALS — BP 125/85 | HR 103 | Ht 60.0 in | Wt 125.0 lb

## 2022-03-18 DIAGNOSIS — N760 Acute vaginitis: Secondary | ICD-10-CM | POA: Diagnosis not present

## 2022-03-18 DIAGNOSIS — B9689 Other specified bacterial agents as the cause of diseases classified elsewhere: Secondary | ICD-10-CM | POA: Diagnosis not present

## 2022-03-18 DIAGNOSIS — B3731 Acute candidiasis of vulva and vagina: Secondary | ICD-10-CM | POA: Diagnosis not present

## 2022-03-18 MED ORDER — METRONIDAZOLE 500 MG PO TABS: 500.0000 mg | ORAL_TABLET | Freq: Two times a day (BID) | ORAL | 1 refills | Status: AC

## 2022-03-18 MED ORDER — FLUCONAZOLE 150 MG PO TABS
150.0000 mg | ORAL_TABLET | Freq: Once | ORAL | 1 refills | Status: DC
Start: 2022-03-18 — End: 2022-03-18

## 2022-03-18 NOTE — Progress Notes (Signed)
Pt in office for reoccurring yeast and BV. Pt does not have any symptoms for yeast infection of BV but tested positive on  4/12. Pt has had recurring infections since December 2022.  ?

## 2022-03-18 NOTE — Progress Notes (Signed)
? ?  GYNECOLOGY PROGRESS NOTE ? ?History:  ?21 y.o. G0P0000 presents to [facility] office today for problem gyn visit. She reports recurrent BV with odor and recurrent yeast infections with itching that started in December 2022.  She was last seen in our office on 03/13/22 for a self-swab and was positive for both BV and yeast. She reports she took a Diflucan on 03/11/22, and today has no vaginal symptoms.   She denies h/a, dizziness, shortness of breath, n/v, or fever/chills.   ? ?The following portions of the patient's history were reviewed and updated as appropriate: allergies, current medications, past family history, past medical history, past social history, past surgical history and problem list. Last pap smear on 12/11/21 was normal. ? ?Health Maintenance Due  ?Topic Date Due  ? HPV VACCINES (3 - 3-dose series) 03/14/2017  ? TETANUS/TDAP  05/23/2021  ?  ? ?Review of Systems:  ?Pertinent items are noted in HPI. ?  ?Objective:  ?Physical Exam ?Blood pressure 125/85, pulse (!) 103, height 5' (1.524 m), weight 125 lb (56.7 kg), last menstrual period 02/23/2022. ?VS reviewed, nursing note reviewed,  ?Constitutional: well developed, well nourished, no distress ?HEENT: normocephalic ?CV: normal rate ?Pulm/chest wall: normal effort ?Breast Exam: deferred ?Abdomen: soft ?Neuro: alert and oriented x 3 ?Skin: warm, dry ?Psych: affect normal ?Pelvic exam: Cervix pink, visually closed, without lesion, scant white creamy discharge, vaginal walls and external genitalia normal ?Bimanual exam: Cervix 0/long/high, firm, anterior, neg CMT, uterus nontender, nonenlarged, adnexa without tenderness, enlargement, or mass ? ?Assessment & Plan:  ?1. Recurrent vaginitis ?--Pt was positive for both candida and BV on 4/12 but took Diflucan at home on 4/10 and now has no symptoms. ?--Discussed options and plan to send Rx today but pt may wait and only start taking if symptoms return. ?--Reviewed prevention of BV, including probiotics and  use of boric acid suppositories PRN ?--Pt to f/u if recurrent symptoms despite treatment ? ?- metroNIDAZOLE (FLAGYL) 500 MG tablet; Take 1 tablet (500 mg total) by mouth 2 (two) times daily for 7 days.  Dispense: 14 tablet; Refill: 1 ?- fluconazole (DIFLUCAN) 150 MG tablet; Take 1 tablet (150 mg total) by mouth once for 1 dose.  Dispense: 1 tablet; Refill: 1 ? ?2. Vaginal candidiasis ? ? ?3. Bacterial vaginosis ? ?  ? ?No follow-ups on file.  ? ?Sharen Counter, CNM ?2:32 PM  ?

## 2022-04-10 ENCOUNTER — Other Ambulatory Visit: Payer: Self-pay

## 2022-04-10 ENCOUNTER — Ambulatory Visit (HOSPITAL_COMMUNITY)
Admission: EM | Admit: 2022-04-10 | Discharge: 2022-04-10 | Disposition: A | Discharge status: Home / Self Care | Payer: Medicaid Other

## 2022-04-10 ENCOUNTER — Encounter (HOSPITAL_COMMUNITY): Payer: Self-pay | Admitting: Emergency Medicine

## 2022-04-10 DIAGNOSIS — G43711 Chronic migraine without aura, intractable, with status migrainosus: Secondary | ICD-10-CM

## 2022-04-10 HISTORY — DX: Migraine, unspecified, not intractable, without status migrainosus: G43.909

## 2022-04-10 MED ORDER — DEXAMETHASONE SODIUM PHOSPHATE 10 MG/ML IJ SOLN
INTRAMUSCULAR | Status: AC
  Filled 2022-04-10: qty 1

## 2022-04-10 MED ORDER — KETOROLAC TROMETHAMINE 30 MG/ML IJ SOLN
30.0000 mg | Freq: Once | INTRAMUSCULAR | Status: AC
Start: 2022-04-10 — End: 2022-04-10
  Administered 2022-04-10: 30 mg via INTRAMUSCULAR

## 2022-04-10 MED ORDER — KETOROLAC TROMETHAMINE 30 MG/ML IJ SOLN
INTRAMUSCULAR | Status: AC
  Filled 2022-04-10: qty 1

## 2022-04-10 MED ORDER — PROPRANOLOL HCL 40 MG PO TABS: 40.0000 mg | ORAL_TABLET | Freq: Every day | ORAL | 0 refills | Status: DC

## 2022-04-10 MED ORDER — DEXAMETHASONE SODIUM PHOSPHATE 10 MG/ML IJ SOLN
10.0000 mg | Freq: Once | INTRAMUSCULAR | Status: AC
  Administered 2022-04-10: 10 mg via INTRAMUSCULAR

## 2022-04-10 MED ORDER — CYCLOBENZAPRINE HCL 10 MG PO TABS: 10.0000 mg | ORAL_TABLET | Freq: Every evening | ORAL | 0 refills | Status: DC | PRN

## 2022-04-10 NOTE — Discharge Instructions (Addendum)
-   We have given you injections of Toradol 30 mg and Decadron 10 mg today to help with your migraine ?-Please start on the propanolol 40 mg daily to prevent migraines ?-If your migraines persist despite this treatment, please follow-up with your primary care provider or a neurologist ?-If your migraines worsen or you have the worst headache of your life, please go to the emergency room ?

## 2022-04-10 NOTE — ED Triage Notes (Signed)
Patient c/o Migraine x 2-3 months.  ? ?Patient endorses nausea at times.  ? ?Patient endorses photosensitivity today.  ? ?Patient endorses more frequent episodes of migraines at least " 3 times a week".  ? ?Patient is waiting for neurologist appointment.  ? ?Patient has used Excedrin with minimal relief of symptoms.  ? ? ?

## 2022-04-10 NOTE — ED Provider Notes (Signed)
?[facility] ? ? ? ?CSN: [id number] ?Arrival date & time: 04/10/22  1409 ? ? ?  ? ?History   ?Chief Complaint ?Chief Complaint  ?Patient presents with  ? Migraine  ? ? ?HPI ?Madeline Freeman is a 21 y.o. female.  ? ?Patient presents with migraine that has been ongoing for the past couple of days.  She reports over the past couple of months, migraines have been worse.  She currently has a migraine; rates as a 6 out of 10.  She reports it is on her temples bilaterally and feels a it is going straight through her head.  Denies any radiation, however sometimes does have pain rating down to the neck.  Reports that migraines typically last 1 to 2 days.  Denies aura, phonophobia, confusion, gait disturbance, behavioral changes and fevers with the migraines.  She does sometimes have some nausea/vomiting, some photophobia, and does miss work because of them.  She reports she has been missing a lot of work recently and she works at a [employer].  Usually, Excedrin works pretty well for the migraine, however recently has not been working as well for her.  She took Imitrex in the past, however this made her too sleepy and she cannot take it. ? ?She does wear glasses; reports she had her eyes checked yesterday.  She has been trying to determine but cannot figure a trigger for the migraines. ? ?She reports a long history of migraines since she was in a elementary school.  She also reports family history in her mother and father migraine. ? ? ? ? ?Past Medical History:  ?Diagnosis Date  ? Anxiety   ? Depression   ? Migraines   ? ? ?Patient Active Problem List  ? Diagnosis Date Noted  ? Acute cholecystitis 09/19/2021  ? Cholecystitis, acute with cholelithiasis 09/19/2021  ? Dyspareunia in female 09/20/2019  ? Depo-Provera contraceptive status 11/30/2018  ? Scoliosis 11/30/2018  ? ? ?Past Surgical History:  ?Procedure Laterality Date  ? CHOLECYSTECTOMY N/A 09/19/2021  ? Procedure: LAPAROSCOPIC CHOLECYSTECTOMY WITH  INTRAOPERATIVE CHOLANGIOGRAM;  Surgeon: Quentin Ore, MD;  Location: [facility] OR;  Service: General;  Laterality: N/A;  ? WISDOM TOOTH EXTRACTION  2018  ? ? ?OB History   ? ? Gravida  ?0  ? Para  ?0  ? Term  ?0  ? Preterm  ?0  ? AB  ?0  ? Living  ?0  ?  ? ? SAB  ?0  ? IAB  ?0  ? Ectopic  ?0  ? Multiple  ?0  ? Live Births  ?0  ?   ?  ?  ? ? ? ?Home Medications   ? ?Prior to Admission medications   ?Medication Sig Start Date End Date Taking? Authorizing Provider  ?Aspirin-Acetaminophen-Caffeine (EXCEDRIN PO) Take by mouth.   Yes [provider]  ?cyclobenzaprine (FLEXERIL) 10 MG tablet Take 1 tablet (10 mg total) by mouth at bedtime as needed for muscle spasms. Do not take while driving or operating heavy machinery 04/10/22  Yes Valentino Nose, NP  ?propranolol (INDERAL) 40 MG tablet Take 1 tablet (40 mg total) by mouth daily. 04/10/22  Yes Valentino Nose, NP  ? ? ?Family History ?Family History  ?Problem Relation Age of Onset  ? Endometriosis Mother   ? Bipolar disorder Mother   ? Anxiety disorder Mother   ? Depression Mother   ? Cancer Maternal Grandmother   ? Depression Maternal Grandmother   ? Anxiety disorder  Father   ? Depression Father   ? Alcohol abuse Father   ? Depression Sister   ? Anxiety disorder Sister   ? Sexual abuse Sister   ? ? ?Social History ?Social History  ? ?Tobacco Use  ? Smoking status: Never  ? Smokeless tobacco: Former  ?Vaping Use  ? Vaping Use: Every day  ?Substance Use Topics  ? Alcohol use: Yes  ? Drug use: No  ? ? ? ?Allergies   ?Patient has no known allergies. ? ? ?Review of Systems ?Review of Systems ?Per HPI ? ?Physical Exam ?Triage Vital Signs ?ED Triage Vitals  ?Enc Vitals Group  ?   BP 04/10/22 1511 112/74  ?   Pulse Rate 04/10/22 1511 82  ?   Resp 04/10/22 1511 16  ?   Temp 04/10/22 1511 98.4 ?F (36.9 ?C)  ?   Temp Source 04/10/22 1511 Oral  ?   SpO2 04/10/22 1511 97 %  ?   Weight --   ?   Height --   ?   Head Circumference --   ?   Peak Flow --   ?   Pain  Score 04/10/22 1507 7  ?   Pain Loc --   ?   Pain Edu? --   ?   Excl. in GC? --   ? ?No data found. ? ?Updated Vital Signs ?BP 112/74 (BP Location: Right Arm)   Pulse 82   Temp 98.4 ?F (36.9 ?C) (Oral)   Resp 16   LMP 03/28/2022 (Exact Date)   SpO2 97%  ? ?Visual Acuity ?Right Eye Distance:   ?Left Eye Distance:   ?Bilateral Distance:   ? ?Right Eye Near:   ?Left Eye Near:    ?Bilateral Near:    ? ?Physical Exam ?Vitals and nursing note reviewed.  ?Constitutional:   ?   General: She is not in acute distress. ?   Appearance: Normal appearance. She is not toxic-appearing.  ?Eyes:  ?   General: No scleral icterus.    ?   Right eye: No discharge.     ?   Left eye: No discharge.  ?   Extraocular Movements: Extraocular movements intact.  ?   Pupils: Pupils are equal, round, and reactive to light.  ?Pulmonary:  ?   Effort: Pulmonary effort is normal. No respiratory distress.  ?Skin: ?   General: Skin is warm and dry.  ?   Capillary Refill: Capillary refill takes less than 2 seconds.  ?   Coloration: Skin is not jaundiced or pale.  ?   Findings: No erythema.  ?Neurological:  ?   General: No focal deficit present.  ?   Mental Status: She is alert and oriented to person, place, and time.  ?   Cranial Nerves: No facial asymmetry.  ?   Motor: No weakness.  ?   Coordination: Coordination normal.  ?   Gait: Gait normal.  ?Psychiatric:     ?   Mood and Affect: Mood normal.     ?   Speech: Speech normal.     ?   Behavior: Behavior normal. Behavior is cooperative.     ?   Thought Content: Thought content normal.     ?   Judgment: Judgment normal.  ? ? ? ?UC Treatments / Results  ?Labs ?(all labs ordered are listed, but only abnormal results are displayed) ?Labs Reviewed - No data to display ? ?EKG ? ? ?Radiology ?No results found. ? ?Procedures ?Procedures (  including critical care time) ? ?Medications Ordered in UC ?Medications  ?ketorolac (TORADOL) 30 MG/ML injection 30 mg (30 mg Intramuscular Given 04/10/22 1554)   ?dexamethasone (DECADRON) injection 10 mg (10 mg Intramuscular Given 04/10/22 1554)  ? ? ?Initial Impression / Assessment and Plan / UC Course  ?I have reviewed the triage vital signs and the nursing notes. ? ?Pertinent labs & imaging results that were available during my care of the patient were reviewed by me and considered in my medical decision making (see chart for details). ? ?  ?Treat migraine today with Toradol 30 mg IM and Decadron 10 mg IM.  Start propanolol 40 mg daily for migraine prevention.  Can also use cyclobenzaprine 10 mg at nighttime as needed for neck pain for migraine.  Encouraged close follow-up with primary care and neurology if symptoms persist or worsen despite treatment.  The patient was given the opportunity to ask questions.  All questions answered to their satisfaction.  The patient is in agreement to this plan.  ? ?Final Clinical Impressions(s) / UC Diagnoses  ? ?Final diagnoses:  ?Intractable chronic migraine without aura and with status migrainosus  ? ? ? ?Discharge Instructions   ? ?  ?- We have given you injections of Toradol 30 mg and Decadron 10 mg today to help with your migraine ?-Please start on the propanolol 40 mg daily to prevent migraines ?-If your migraines persist despite this treatment, please follow-up with your primary care provider or a neurologist ?-If your migraines worsen or you have the worst headache of your life, please go to the emergency room ? ? ? ?ED Prescriptions   ? ? Medication Sig Dispense Auth. Provider  ? propranolol (INDERAL) 40 MG tablet Take 1 tablet (40 mg total) by mouth daily. 30 tablet Cathlean MarseillesMartinez, Tiahna Cure A, NP  ? cyclobenzaprine (FLEXERIL) 10 MG tablet Take 1 tablet (10 mg total) by mouth at bedtime as needed for muscle spasms. Do not take while driving or operating heavy machinery 20 tablet Valentino NoseMartinez, Gabryelle Whitmoyer A, NP  ? ?  ? ?PDMP not reviewed this encounter. ?  ?Valentino NoseMartinez, Kaimen Peine A, NP ?04/10/22 1646 ? ?

## 2022-05-04 IMAGING — RF DG CHOLANGIOGRAM OPERATIVE
1 series · 1 of 1 positions shown · non-contrast
Comparison: Ultrasound abdomen limited 09/18/2021

CLINICAL DATA: Laparoscopic cholecystectomy with intraoperative
cholangiogram

EXAM:
INTRAOPERATIVE CHOLANGIOGRAM
TECHNIQUE: Cholangiographic images from the C-arm fluoroscopic device were
submitted for interpretation post-operatively. Please see the
procedural report for the amount of contrast and the fluoroscopy
time utilized.
Fluoro time: 14 seconds
Fluoro dose: 1.9 mGy
Acquired spot images: 1

[Series 1: run · 1 of 1 slices shown]
[im 1/1]
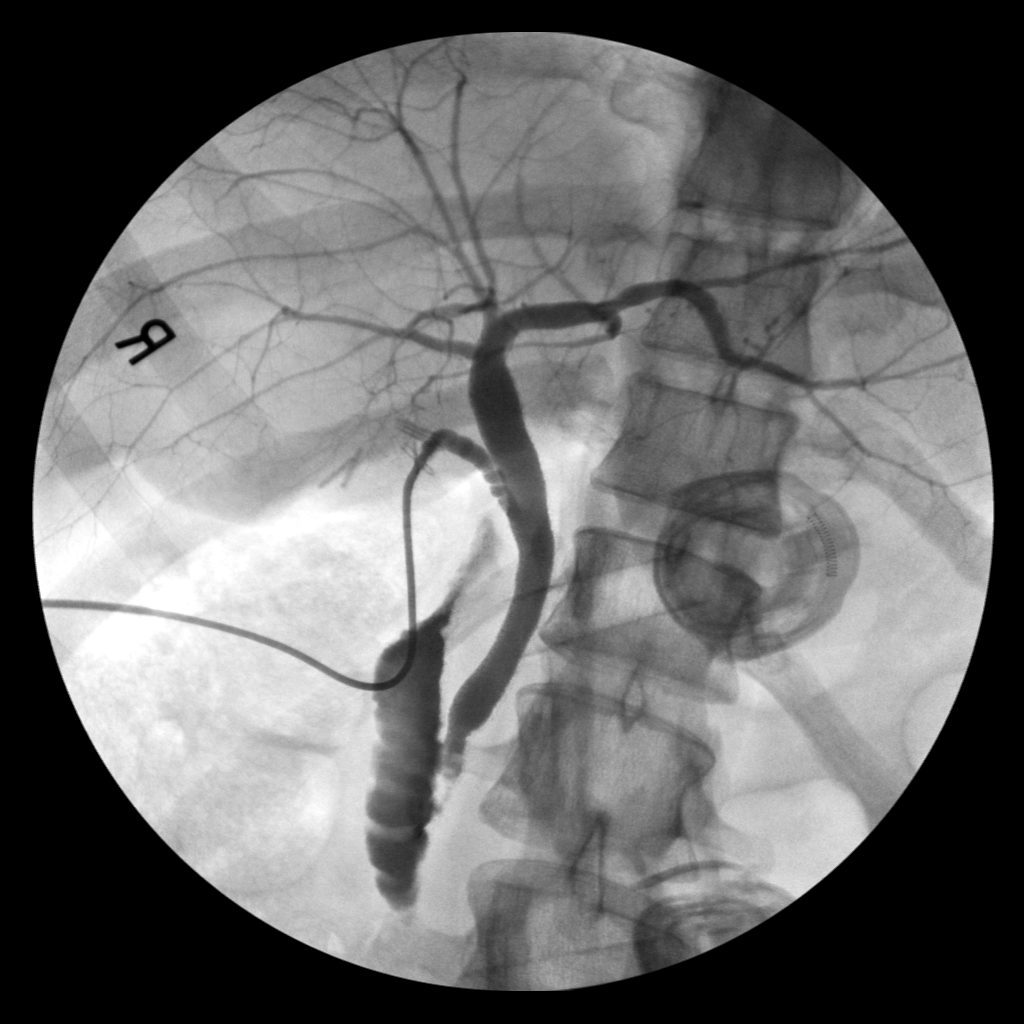

[1 of 1 positions shown; findings below may reference images not displayed]

FINDINGS: Submitted images demonstrate opacification of the intra and
extrahepatic bile ducts through the cystic duct remnant. There is a
small filling defect in the distal CBD which may be due to an air
bubble or small calculus. There is passage of contrast into the
duodenum.
IMPRESSION: Intraoperative cholangiogram as above. Small rounded filling defects
seen in the distal common bile duct may be due to air bubble or
small choledocholith.

## 2022-05-07 ENCOUNTER — Other Ambulatory Visit: Payer: Self-pay | Admitting: Nurse Practitioner

## 2022-05-22 ENCOUNTER — Other Ambulatory Visit (HOSPITAL_COMMUNITY)
Admission: RE | Admit: 2022-05-22 | Discharge: 2022-05-22 | Disposition: A | Discharge status: Home / Self Care | Payer: Medicaid Other | Source: Ambulatory Visit | Attending: Obstetrics and Gynecology | Admitting: Obstetrics and Gynecology

## 2022-05-22 ENCOUNTER — Ambulatory Visit (INDEPENDENT_AMBULATORY_CARE_PROVIDER_SITE_OTHER): Payer: Medicaid Other

## 2022-05-22 ENCOUNTER — Ambulatory Visit: Payer: Medicaid Other | Admitting: Neurology

## 2022-05-22 ENCOUNTER — Encounter: Payer: Self-pay | Admitting: Neurology

## 2022-05-22 VITALS — BP 111/75 | HR 89 | Ht 60.0 in | Wt 122.0 lb

## 2022-05-22 VITALS — BP 107/71 | HR 78 | Ht 60.0 in | Wt 122.6 lb

## 2022-05-22 DIAGNOSIS — N898 Other specified noninflammatory disorders of vagina: Secondary | ICD-10-CM

## 2022-05-22 DIAGNOSIS — G43701 Chronic migraine without aura, not intractable, with status migrainosus: Secondary | ICD-10-CM | POA: Insufficient documentation

## 2022-05-22 DIAGNOSIS — G43009 Migraine without aura, not intractable, without status migrainosus: Secondary | ICD-10-CM

## 2022-05-22 MED ORDER — NURTEC 75 MG PO TBDP: 75.0000 mg | ORAL_TABLET | Freq: Once | ORAL | 0 refills | Status: DC | PRN

## 2022-05-22 MED ORDER — RIZATRIPTAN BENZOATE 10 MG PO TBDP: 10.0000 mg | ORAL_TABLET | ORAL | 11 refills | Status: DC | PRN

## 2022-05-22 MED ORDER — QULIPTA 60 MG PO TABS: 60.0000 mg | ORAL_TABLET | Freq: Every day | ORAL | 0 refills | Status: DC

## 2022-05-22 MED ORDER — ONDANSETRON 4 MG PO TBDP: 4.0000 mg | ORAL_TABLET | Freq: Three times a day (TID) | ORAL | 3 refills | Status: DC | PRN

## 2022-05-22 MED ORDER — QULIPTA 60 MG PO TABS: 60.0000 mg | ORAL_TABLET | Freq: Every day | ORAL | 11 refills | Status: DC

## 2022-05-22 MED ORDER — UBRELVY 100 MG PO TABS: 100.0000 mg | ORAL_TABLET | ORAL | 11 refills | Status: DC | PRN

## 2022-05-22 NOTE — Patient Instructions (Addendum)
Start [place] daily: preventative Emergent/acute: Bernita Raisin. Also Rizatriptan in the meantime. Please take one tablet at the onset of your headache. If it does not improve the symptoms please take one additional tablet. Do not take more then 2 tablets in 24hrs. Do not take use more then 2 to 3 times in a week. Nausea: Ondansetron Take the Maxalt/rizatriptan with the ondansetron at the onset of headache and can repeat in 2 hours.   Ubrogepant Tablets What is this medication? UBROGEPANT (ue BROE je pant) treats migraines. It works by blocking a substance in the body that causes migraines. It is not used to prevent migraines. This medicine may be used for other purposes; ask your health care provider or pharmacist if you have questions. COMMON BRAND NAME(S): Bernita Raisin What should I tell my care team before I take this medication? They need to know if you have any of these conditions: Kidney disease Liver disease An unusual or allergic reaction to ubrogepant, other medications, foods, dyes, or preservatives Pregnant or trying to get pregnant Breast-feeding How should I use this medication? Take this medication by mouth with a glass of water. Take it as directed on the prescription label. You can take it with or without food. If it upsets your stomach, take it with food. Keep taking it unless your care team tells you to stop. Talk to your care team about the use of this medication in children. Special care may be needed. Overdosage: If you think you have taken too much of this medicine contact a poison control center or emergency room at once. NOTE: This medicine is only for you. Do not share this medicine with others. What if I miss a dose? This does not apply. This medication is not for regular use. What may interact with this medication? Do not take this medication with any of the following: Adagrasib Ceritinib Certain antibiotics, such as chloramphenicol, clarithromycin, telithromycin Certain  antivirals for HIV, such as atazanavir, cobicistat, darunavir, delavirdine, fosamprenavir, indinavir, ritonavir Certain medications for fungal infections, such as itraconazole, ketoconazole, posaconazole, voriconazole Conivaptan Grapefruit Idelalisib Mifepristone Nefazodone Ribociclib This medication may also interact with the following: Carvedilol Certain medications for seizures, such as phenobarbital, phenytoin Ciprofloxacin Cyclosporine Eltrombopag Fluconazole Fluvoxamine Quinidine Rifampin St. John's wort Verapamil This list may not describe all possible interactions. Give your health care provider a list of all the medicines, herbs, non-prescription drugs, or dietary supplements you use. Also tell them if you smoke, drink alcohol, or use illegal drugs. Some items may interact with your medicine. What should I watch for while using this medication? Visit your care team for regular checks on your progress. Tell your care team if your symptoms do not start to get better or if they get worse. Your mouth may get dry. Chewing sugarless gum or sucking hard candy and drinking plenty of water may help. Contact your care team if the problem does not go away or is severe. What side effects may I notice from receiving this medication? Side effects that you should report to your care team as soon as possible: Allergic reactions--skin rash, itching, hives, swelling of the face, lips, tongue, or throat Side effects that usually do not require medical attention (report to your care team if they continue or are bothersome): Drowsiness Dry mouth Fatigue Nausea This list may not describe all possible side effects. Call your doctor for medical advice about side effects. You may report side effects to FDA at 1-800-FDA-1088. Where should I keep my medication? Keep out of the  reach of children and pets. Store between 15 and 30 degrees C (59 and 86 degrees F). Get rid of any unused medication after  the expiration date. To get rid of medications that are no longer needed or have expired: Take the medication to a medication take-back program. Check with your pharmacy or law enforcement to find a location. If you cannot return the medication, check the label or package insert to see if the medication should be thrown out in the garbage or flushed down the toilet. If you are not sure, ask your care team. If it is safe to put it in the trash, pour the medication out of the container. Mix the medication with cat litter, dirt, coffee grounds, or other unwanted substance. Seal the mixture in a bag or container. Put it in the trash. NOTE: This sheet is a summary. It may not cover all possible information. If you have questions about this medicine, talk to your doctor, pharmacist, or health care provider.  2023 Else[employer] Standard (2022-01-11 00:00:00) Atogepant Tablets What is this medication? ATOGEPANT (a TOE je pant) prevents migraines. It works by blocking a substance in the body that causes migraines. This medicine may be used for other purposes; ask your health care provider or pharmacist if you have questions. COMMON BRAND NAME(S): QULIPTA What should I tell my care team before I take this medication? They need to know if you have any of these conditions: Kidney disease Liver disease An unusual or allergic reaction to atogepant, other medications, foods, dyes, or preservatives Pregnant or trying to get pregnant Breast-feeding How should I use this medication? Take this medication by mouth with water. Take it as directed on the prescription label at the same time every day. You can take it with or without food. If it upsets your stomach, take it with food. Keep taking it unless your care team tells you to stop. Talk to your care team about the use of this medication in children. Special care may be needed. Overdosage: If you think you have taken too much of this medicine contact a poison  control center or emergency room at once. NOTE: This medicine is only for you. Do not share this medicine with others. What if I miss a dose? If you miss a dose, take it as soon as you can. If it is almost time for your next dose, take only that dose. Do not take double or extra doses. What may interact with this medication? Carbamazepine Certain medications for fungal infections, such as itraconazole, ketoconazole Clarithromycin Cyclosporine Efavirenz Etravirine Phenytoin Rifampin St. John's wort This list may not describe all possible interactions. Give your health care provider a list of all the medicines, herbs, non-prescription drugs, or dietary supplements you use. Also tell them if you smoke, drink alcohol, or use illegal drugs. Some items may interact with your medicine. What should I watch for while using this medication? Visit your care team for regular checks on your progress. Tell your care team if your symptoms do not start to get better or if they get worse. What side effects may I notice from receiving this medication? Side effects that you should report to your care team as soon as possible: Allergic reactions--skin rash, itching, hives, swelling of the face, lips, tongue, or throat Side effects that usually do not require medical attention (report to your care team if they continue or are bothersome): Constipation Fatigue Loss of appetite with weight loss Nausea This list may not describe all possible  side effects. Call your doctor for medical advice about side effects. You may report side effects to FDA at 1-800-FDA-1088. Where should I keep my medication? Keep out of the reach of children and pets. Store at room temperature between 20 and 25 degrees C (68 and 77 degrees F). Get rid of any unused medication after the expiration date. To get rid of medications that are no longer needed or have expired: Take the medication to a medication take-back program. Check with  your pharmacy or law enforcement to find a location. If you cannot return the medication, check the label or package insert to see if the medication should be thrown out in the garbage or flushed down the toilet. If you are not sure, ask your care team. If it is safe to put it in the trash, take the medication out of the container. Mix the medication with cat litter, dirt, coffee grounds, or other unwanted substance. Seal the mixture in a bag or container. Put it in the trash. NOTE: This sheet is a summary. It may not cover all possible information. If you have questions about this medicine, talk to your doctor, pharmacist, or health care provider.  2023 Else[employer]/Gold Standard (2022-01-07 00:00:00)  Ondansetron Dissolving Tablets What is this medication? ONDANSETRON (on DAN se tron) prevents nausea and vomiting from chemotherapy, radiation, or surgery. It works by blocking substances in the body that may cause nausea or vomiting. It belongs to a group of medications called antiemetics. This medicine may be used for other purposes; ask your health care provider or pharmacist if you have questions. COMMON BRAND NAME(S): Zofran ODT What should I tell my care team before I take this medication? They need to know if you have any of these conditions: Heart disease History of irregular heartbeat Liver disease Low levels of magnesium or potassium in the blood An unusual or allergic reaction to ondansetron, granisetron, other medications, foods, dyes, or preservatives Pregnant or trying to get pregnant Breast-feeding How should I use this medication? These tablets are made to dissolve in the mouth. Do not try to push the tablet through the foil backing. With dry hands, peel away the foil backing and gently remove the tablet. Place the tablet in the mouth and allow it to dissolve, then swallow. While you may take these tablets with water, it is not necessary to do so. Talk to your care team regarding the  use of this medication in children. Special care may be needed. Overdosage: If you think you have taken too much of this medicine contact a poison control center or emergency room at once. NOTE: This medicine is only for you. Do not share this medicine with others. What if I miss a dose? If you miss a dose, take it as soon as you can. If it is almost time for your next dose, take only that dose. Do not take double or extra doses. What may interact with this medication? Do not take this medication with any of the following: Apomorphine Certain medications for fungal infections like fluconazole, itraconazole, ketoconazole, posaconazole, voriconazole Cisapride Dronedarone Pimozide Thioridazine This medication may also interact with the following: Carbamazepine Certain medications for depression, anxiety, or psychotic disturbances Fentanyl Linezolid MAOIs like Carbex, Eldepryl, Marplan, Nardil, and Parnate Methylene blue (injected into a vein) Other medications that prolong the QT interval (cause an abnormal heart rhythm) like dofetilide, ziprasidone Phenytoin Rifampicin Tramadol This list may not describe all possible interactions. Give your health care provider a list of all the  medicines, herbs, non-prescription drugs, or dietary supplements you use. Also tell them if you smoke, drink alcohol, or use illegal drugs. Some items may interact with your medicine. What should I watch for while using this medication? Check with your care team as soon as you can if you have any sign of an allergic reaction. What side effects may I notice from receiving this medication? Side effects that you should report to your care team as soon as possible: Allergic reactions--skin rash, itching, hives, swelling of the face, lips, tongue, or throat Bowel blockage--stomach cramping, unable to have a bowel movement or pass gas, loss of appetite, vomiting Chest pain (angina)--pain, pressure, or tightness in the  chest, neck, back, or arms Heart rhythm changes--fast or irregular heartbeat, dizziness, feeling faint or lightheaded, chest pain, trouble breathing Irritability, confusion, fast or irregular heartbeat, muscle stiffness, twitching muscles, sweating, high fever, seizure, chills, vomiting, diarrhea, which may be signs of serotonin syndrome Side effects that usually do not require medical attention (report to your care team if they continue or are bothersome): Constipation Diarrhea General discomfort and fatigue Headache This list may not describe all possible side effects. Call your doctor for medical advice about side effects. You may report side effects to FDA at 1-800-FDA-1088. Where should I keep my medication? Keep out of the reach of children and pets. Store between 2 and 30 degrees C (36 and 86 degrees F). Throw away any unused medication after the expiration date. NOTE: This sheet is a summary. It may not cover all possible information. If you have questions about this medicine, talk to your doctor, pharmacist, or health care provider.  2023 Elsevier/Gold Standard (2020-12-22 00:00:00) Rizatriptan Disintegrating Tablets What is this medication? RIZATRIPTAN (rye za TRIP tan) treats migraines. It works by blocking pain signals and narrowing blood vessels in the brain. It belongs to a group of medications called triptans. It is not used to prevent migraines. This medicine may be used for other purposes; ask your health care provider or pharmacist if you have questions. COMMON BRAND NAME(S): Maxalt-MLT What should I tell my care team before I take this medication? They need to know if you have any of these conditions: Cigarette smoker Circulation problems in fingers and toes Diabetes Heart disease High blood pressure High cholesterol History of irregular heartbeat History of stroke Kidney disease Liver disease Stomach or intestine problems An unusual or allergic reaction to  rizatriptan, other medications, foods, dyes, or preservatives Pregnant or trying to get pregnant Breast-feeding How should I use this medication? Take this medication by mouth. Follow the directions on the prescription label. Leave the tablet in the sealed blister pack until you are ready to take it. With dry hands, open the blister and gently remove the tablet. If the tablet breaks or crumbles, throw it away and take a new tablet out of the blister pack. Place the tablet in the mouth and allow it to dissolve, and then swallow. Do not cut, crush, or chew this medication. You do not need water to take this medication. Do not take it more often than directed. Talk to your care team regarding the use of this medication in children. While this medication may be prescribed for children as young as 6 years for selected conditions, precautions do apply. Overdosage: If you think you have taken too much of this medicine contact a poison control center or emergency room at once. NOTE: This medicine is only for you. Do not share this medicine with others. What if  I miss a dose? This does not apply. This medication is not for regular use. What may interact with this medication? Do not take this medication with any of the following medications: Certain medications for migraine headache like almotriptan, eletriptan, frovatriptan, naratriptan, rizatriptan, sumatriptan, zolmitriptan Ergot alkaloids like dihydroergotamine, ergonovine, ergotamine, methylergonovine MAOIs like Carbex, Eldepryl, Marplan, Nardil, and Parnate This medication may also interact with the following medications: Certain medications for depression, anxiety, or psychotic disorders Propranolol This list may not describe all possible interactions. Give your health care provider a list of all the medicines, herbs, non-prescription drugs, or dietary supplements you use. Also tell them if you smoke, drink alcohol, or use illegal drugs. Some items  may interact with your medicine. What should I watch for while using this medication? Visit your care team for regular checks on your progress. Tell your care team if your symptoms do not start to get better or if they get worse. You may get drowsy or dizzy. Do not drive, use machinery, or do anything that needs mental alertness until you know how this medication affects you. Do not stand up or sit up quickly, especially if you are an older patient. This reduces the risk of dizzy or fainting spells. Alcohol may interfere with the effect of this medication. Your mouth may get dry. Chewing sugarless gum or sucking hard candy and drinking plenty of water may help. Contact your care team if the problem does not go away or is severe. If you take migraine medications for 10 or more days a month, your migraines may get worse. Keep a diary of headache days and medication use. Contact your care team if your migraine attacks occur more frequently. What side effects may I notice from receiving this medication? Side effects that you should report to your care team as soon as possible: Allergic reactions--skin rash, itching, hives, swelling of the face, lips, tongue, or throat Burning, pain, tingling, or color changes in the legs or feet Heart attack--pain or tightness in the chest, shoulders, arms, or jaw, nausea, shortness of breath, cold or clammy skin, feeling faint or lightheaded Heart rhythm changes--fast or irregular heartbeat, dizziness, feeling faint or lightheaded, chest pain, trouble breathing Increase in blood pressure Irritability, confusion, fast or irregular heartbeat, muscle stiffness, twitching muscles, sweating, high fever, seizure, chills, vomiting, diarrhea, which may be signs of serotonin syndrome Raynaud's--cool, numb, or painful fingers or toes that may change color from pale, to blue, to red Seizures Stroke--sudden numbness or weakness of the face, arm, or leg, trouble speaking, confusion,  trouble walking, loss of balance or coordination, dizziness, severe headache, change in vision Sudden or severe stomach pain, nausea, vomiting, fever, or bloody diarrhea Vision loss Side effects that usually do not require medical attention (report to your care team if they continue or are bothersome): Dizziness General discomfort or fatigue This list may not describe all possible side effects. Call your doctor for medical advice about side effects. You may report side effects to FDA at 1-800-FDA-1088. Where should I keep my medication? Keep out of the reach of children and pets. Store at room temperature between 15 and 30 degrees C (59 and 86 degrees F). Protect from light and moisture. Throw away any unused medication after the expiration date. NOTE: This sheet is a summary. It may not cover all possible information. If you have questions about this medicine, talk to your doctor, pharmacist, or health care provider.  2023 [employer]/Gold Standard (2020-12-27 00:00:00)

## 2022-05-22 NOTE — Progress Notes (Signed)
[facility]    Provider:  Dr Jaynee Eagles Requesting Provider: Warden Fillers, MD Primary Care Provider:  Patient, No Pcp Per  CC:  Migraines  HPI:  Chanise Donn Mastin is a 21 y.o. female here as requested by Warden Fillers, MD for migraines.  I reviewed Dr. 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  She has had migraines her whole life, father and father's mother. Typically it feels like it stars on the right side and spreads to the temples and behind the eyes, pulsating/pounding/throbbing/nausea and vomiting, she has 10 total headache days a month, photophobia/phonophobia, she can't move, has to be in a dark room, sounds will make it worse, excedrin only helps a little. She will get very dizzy and unstable. She has 6 migraine days a month that are moderate to severe and can last up to 24 hours but she had has one 3-4 days. No medication overuse. No aura. No other focal neurologic deficits, associated symptoms, inciting events or modifiable factors.  Reviewed notes, labs and imaging from outside physicians, which showed:   05/26/2014: CT head showed No acute intracranial abnormalities including mass lesion or mass effect, hydrocephalus, extra-axial fluid collection, midline shift, hemorrhage, or acute infarction, large ischemic events (personally reviewed  images)    From a thorough review of records patient has tried: imitrex made her nauseated, propranolol, flexeril, etodolac, toradol, ibuprofen, tylenol, excedrin, lamictal, propranolol (she had hypotension), amitriptyline (sedation/drowsiness), zoloft, effexor, topiramate, rizatriptan, Injectables contraindicated due to extreme fear of needles and vasovagal, nurtec  Review of Systems: Patient complains of symptoms per HPI as well as the following symptoms migraines. Pertinent negatives and positives per HPI. All others negative.   Social History   Socioeconomic History   Marital status: Single    Spouse name: Not on file   Number of children: 0   Years of education: Not on file   Highest education level: Some college, no degree  Occupational History   Not on file  Tobacco Use   Smoking status: Never   Smokeless tobacco: Never  Vaping Use   Vaping Use: Every day  Substance and Sexual Activity   Alcohol use: Yes    Comment: occ/ social   Drug use: No   Sexual activity: Yes    Birth control/protection: None  Other Topics Concern   Not on file  Social History Narrative   Caffeine coffee in am,  soda pm   Education some college   Works for [employer].    Social Determinants of Health   Financial Resource Strain: Low Risk  (06/02/2018)   Overall Financial Resource Strain (CARDIA)    Difficulty of Paying Living Expenses: Not hard at all  Food Insecurity: No Food Insecurity (06/02/2018)   Hunger Vital Sign    Worried About Running Out of Food in the Last Year: Never true    Ran Out of Food in the Last Year: Never true  Transportation Needs: No Transportation Needs (06/02/2018)   PRAPARE - Administrator, Civil Service (Medical): No    Lack of Transportation (Non-Medical): No  Physical Activity: Inactive (06/02/2018)   Exercise Vital Sign    Days of Exercise per Week: 0 days    Minutes of Exercise per Session: 0 min  Stress: Stress Concern Present (06/02/2018)    [employer] of Occupational Health - Occupational Stress Questionnaire    Feeling of Stress : Very much  Social Connections: Somewhat Isolated (06/02/2018)   Social Connection and Isolation Panel [NHANES]    Frequency of Communication with Friends and Family: More than three times a week    Frequency of Social Gatherings with Friends and Family: Three times a week    Attends Religious Services: More than 4 times per year    Active Member of Clubs or Organizations: No    Attends Banker Meetings: Never    Marital Status: Never married  Intimate Partner Violence: Not At Risk (06/02/2018)   Humiliation, Afraid, Rape, and Kick questionnaire    Fear of Current or Ex-Partner: No    Emotionally Abused: No    Physically Abused: No    Sexually Abused: No    Family History  Problem Relation Age of Onset   Endometriosis Mother    Bipolar disorder Mother    Anxiety disorder Mother    Depression Mother    Migraines Father    Anxiety disorder Father    Depression Father    Alcohol abuse Father    Depression Sister    Anxiety disorder Sister    Sexual abuse Sister    Migraines Maternal Grandmother    Cancer Maternal Grandmother    Depression Maternal Grandmother     Past Medical History:  Diagnosis Date   Anxiety    Depression    Migraines     Patient Active Problem List   Diagnosis Date Noted   Chronic migraine without aura with status migrainosus, not intractable 05/22/2022   Acute cholecystitis 09/19/2021   Cholecystitis, acute with cholelithiasis 09/19/2021   Dyspareunia in female 09/20/2019   Depo-Provera contraceptive status 11/30/2018   Scoliosis 11/30/2018    Past Surgical History:  Procedure Laterality Date   CHOLECYSTECTOMY N/A 09/19/2021   Procedure: LAPAROSCOPIC CHOLECYSTECTOMY WITH INTRAOPERATIVE CHOLANGIOGRAM;  Surgeon: Quentin Ore, MD;  Location: [facility] OR;  Service: General;  Laterality: N/A;   WISDOM TOOTH EXTRACTION  2018    Current  Outpatient Medications  Medication Sig Dispense Refill   Aspirin-Acetaminophen-Caffeine (EXCEDRIN PO) Take by mouth.     Atogepant (QULIPTA) 60 MG TABS Take 60 mg by mouth daily. 20 tablet 0   Atogepant (QULIPTA) 60 MG TABS Take 60 mg by mouth daily. 30 tablet 11   ondansetron (ZOFRAN-ODT) 4 MG disintegrating tablet Take 1-2 tablets (4-8 mg total) by mouth every 8 (eight) hours as needed. 30 tablet 3   rizatriptan (MAXALT-MLT) 10 MG disintegrating tablet Take 1 tablet (10 mg total) by mouth as needed for migraine. May repeat in 2 hours if needed 9 tablet 11   Ubrogepant (UBRELVY) 100 MG TABS Take 100 mg by mouth every 2 (two) hours as needed. Maximum 200mg  a day. 16 tablet 11   No current facility-administered medications for this visit.    Allergies as of 05/22/2022   (No Known Allergies)    Vitals: BP 107/71   Pulse 78   Ht 5' (1.524 m)   Wt 122 lb 9.6 oz (55.6 kg)  BMI 23.94 kg/m  Last Weight:  Wt Readings from Last 1 Encounters:  05/22/22 122 lb 9.6 oz (55.6 kg)   Last Height:   Ht Readings from Last 1 Encounters:  05/22/22 5' (1.524 m)     Physical exam: Exam: Gen: NAD, conversant, well nourised, well groomed                     CV: RRR, no MRG. No Carotid Bruits. No peripheral edema, warm, nontender Eyes: Conjunctivae clear without exudates or hemorrhage  Neuro: Detailed Neurologic Exam  Speech:    Speech is normal; fluent and spontaneous with normal comprehension.  Cognition:    The patient is oriented to person, place, and time;     recent and remote memory intact;     language fluent;     normal attention, concentration,     fund of knowledge Cranial Nerves:    The pupils are equal, round, and reactive to light. The fundi are normal and spontaneous venous pulsations are present. Visual fields are full to finger confrontation. Extraocular movements are intact. Trigeminal sensation is intact and the muscles of mastication are normal. The face is symmetric.  The palate elevates in the midline. Hearing intact. Voice is normal. Shoulder shrug is normal. The tongue has normal motion without fasciculations.   Coordination:    Normal   Gait:    normal.   Motor Observation:    No asymmetry, no atrophy, and no involuntary movements noted. Tone:    Normal muscle tone.    Posture:    Posture is normal. normal erect    Strength:    Strength is V/V in the upper and lower limbs.      Sensation: intact to LT     Reflex Exam:  DTR's:    Deep tendon reflexes in the upper and lower extremities are normal bilaterally.   Toes:    The toes are downgoing bilaterally.   Clonus:    Clonus is absent.    Assessment/Plan:  Patient with migraines, we had a long conversation about options:  Start Qulipta daily: preventative Emergent/acute: Bernita Raisin. Also Rizatriptan in the meantime. Please take one tablet at the onset of your headache. If it does not improve the symptoms please take one additional tablet. Do not take more then 2 tablets in 24hrs. Do not take use more then 2 to 3 times in a week. Nausea: Ondansetron Take the Maxalt/rizatriptan with the ondansetron at the onset of headache and can repeat in 2 hours.  Discussed teratogenicity do not get pregnant on these medcations  From a thorough review of records patient has tried: imitrex made her nauseated, propranolol, flexeril, etodolac, toradol, ibuprofen, tylenol, excedrin, lamictal, propranolol (she had hypotension), amitriptyline (sedation/drowsiness), zoloft, effexor, topiramate, rizatriptan, Ajovy/Emgality Injectables contraindicated due to extreme fear of needles and vasovagal syncope, nurtec  Discussed: To prevent or relieve headaches, try the following: Cool Compress. Lie down and place a cool compress on your head.  Avoid headache triggers. If certain foods or odors seem to have triggered your migraines in the past, avoid them. A headache diary might help you identify triggers.  Include  physical activity in your daily routine. Try a daily walk or other moderate aerobic exercise.  Manage stress. Find healthy ways to cope with the stressors, such as delegating tasks on your to-do list.  Practice relaxation techniques. Try deep breathing, yoga, massage and visualization.  Eat regularly. Eating regularly scheduled meals and maintaining a healthy diet might help  prevent headaches. Also, drink plenty of fluids.  Follow a regular sleep schedule. Sleep deprivation might contribute to headaches Consider biofeedback. With this mind-body technique, you learn to control certain bodily functions -- such as muscle tension, heart rate and blood pressure -- to prevent headaches or reduce headache pain.    Proceed to emergency room if you experience new or worsening symptoms or symptoms do not resolve, if you have new neurologic symptoms or if headache is severe, or for any concerning symptom.   Provided education and documentation from American headache Society toolbox including articles on: chronic migraine medication overuse headache, chronic migraines, prevention of migraines, behavioral and other nonpharmacologic treatments for headache.   Orders Placed This Encounter  Procedures   CBC with Differential/Platelets   Comprehensive metabolic panel   TSH   Meds ordered this encounter  Medications   Atogepant (QULIPTA) 60 MG TABS    Sig: Take 60 mg by mouth daily.    Dispense:  20 tablet    Refill:  0   rizatriptan (MAXALT-MLT) 10 MG disintegrating tablet    Sig: Take 1 tablet (10 mg total) by mouth as needed for migraine. May repeat in 2 hours if needed    Dispense:  9 tablet    Refill:  11   ondansetron (ZOFRAN-ODT) 4 MG disintegrating tablet    Sig: Take 1-2 tablets (4-8 mg total) by mouth every 8 (eight) hours as needed.    Dispense:  30 tablet    Refill:  3   DISCONTD: Rimegepant Sulfate (NURTEC) 75 MG TBDP    Sig: Take 75 mg by mouth once as needed for up to 1 dose.     Dispense:  6 tablet    Refill:  0   Atogepant (QULIPTA) 60 MG TABS    Sig: Take 60 mg by mouth daily.    Dispense:  30 tablet    Refill:  11   Ubrogepant (UBRELVY) 100 MG TABS    Sig: Take 100 mg by mouth every 2 (two) hours as needed. Maximum 200mg  a day.    Dispense:  16 tablet    Refill:  11    Cc: Warden Fillers, MD,  Patient, No Pcp Per  Sarina Ill, MD  [facility] [address] [place], [place] [address]  Phone [phone] Fax [phone]

## 2022-05-22 NOTE — Progress Notes (Signed)
SUBJECTIVE:  21 y.o. female complains of thick vaginal discharge, vaginal itching for 15 day(s). Denies abnormal vaginal bleeding or significant pelvic pain or fever. No UTI symptoms. Denies history of known exposure to STD.  Patient's last menstrual period was 05/16/2022 (exact date).  OBJECTIVE:  She appears well, afebrile. Urine dipstick: not done.  ASSESSMENT:  Vaginal Discharge  Vaginal Itching   PLAN:  GC, chlamydia, trichomonas, BVAG, CVAG probe sent to lab. Treatment: To be determined once lab results are received ROV prn if symptoms persist or worsen.

## 2022-05-22 NOTE — Progress Notes (Signed)
Agree with nurses's documentation of this patient's clinic encounter.  Jadee Golebiewski L, MD  

## 2022-05-23 ENCOUNTER — Telehealth: Payer: Self-pay | Admitting: *Deleted

## 2022-05-23 LAB — COMPREHENSIVE METABOLIC PANEL
ALT: 17 IU/L (ref 0–32)
AST: 16 IU/L (ref 0–40)
Albumin/Globulin Ratio: 1.9 (ref 1.2–2.2)
Albumin: 5 g/dL (ref 3.9–5.0)
Alkaline Phosphatase: 54 IU/L (ref 44–121)
BUN/Creatinine Ratio: 12 (ref 9–23)
BUN: 9 mg/dL (ref 6–20)
Bilirubin Total: 0.5 mg/dL (ref 0.0–1.2)
CO2: 23 mmol/L (ref 20–29)
Calcium: 10 mg/dL (ref 8.7–10.2)
Chloride: 104 mmol/L (ref 96–106)
Creatinine, Ser: 0.74 mg/dL (ref 0.57–1.00)
Globulin, Total: 2.6 g/dL (ref 1.5–4.5)
Glucose: 90 mg/dL (ref 70–99)
Potassium: 4.6 mmol/L (ref 3.5–5.2)
Sodium: 141 mmol/L (ref 134–144)
Total Protein: 7.6 g/dL (ref 6.0–8.5)
eGFR: 117 mL/min/{1.73_m2} (ref >59)

## 2022-05-23 LAB — CERVICOVAGINAL ANCILLARY ONLY
Bacterial Vaginitis (gardnerella): NEGATIVE
Candida Glabrata: NEGATIVE
Candida Vaginitis: POSITIVE — AB
Chlamydia: NEGATIVE
Comment: NEGATIVE
Comment: NEGATIVE
Comment: NEGATIVE
Comment: NEGATIVE
Comment: NEGATIVE
Comment: NORMAL
Neisseria Gonorrhea: NEGATIVE
Trichomonas: NEGATIVE

## 2022-05-23 LAB — CBC WITH DIFFERENTIAL/PLATELET
Basophils Absolute: 0.1 10*3/uL (ref 0.0–0.2)
Basos: 1 %
EOS (ABSOLUTE): 0.1 10*3/uL (ref 0.0–0.4)
Eos: 1 %
Hematocrit: 44.1 % (ref 34.0–46.6)
Hemoglobin: 14.9 g/dL (ref 11.1–15.9)
Immature Grans (Abs): 0 10*3/uL (ref 0.0–0.1)
Immature Granulocytes: 0 %
Lymphocytes Absolute: 2 10*3/uL (ref 0.7–3.1)
Lymphs: 33 %
MCH: 30.8 pg (ref 26.6–33.0)
MCHC: 33.8 g/dL (ref 31.5–35.7)
MCV: 91 fL (ref 79–97)
Monocytes Absolute: 0.4 10*3/uL (ref 0.1–0.9)
Monocytes: 7 %
Neutrophils Absolute: 3.4 10*3/uL (ref 1.4–7.0)
Neutrophils: 58 %
Platelets: 392 10*3/uL (ref 150–450)
RBC: 4.84 x10E6/uL (ref 3.77–5.28)
RDW: 12.4 % (ref 11.7–15.4)
WBC: 6 10*3/uL (ref 3.4–10.8)

## 2022-05-23 LAB — TSH: TSH: 0.956 u[IU]/mL (ref 0.450–4.500)

## 2022-05-23 NOTE — Telephone Encounter (Signed)
Received e mail: documents attached. The [employer] is reviewing your PA request. Typically an electronic response will be received within 24-72 hours.  

## 2022-05-23 NOTE — Telephone Encounter (Signed)
Bernita Raisin PA, Key: [id number], faxed notes to be attached.

## 2022-05-24 ENCOUNTER — Other Ambulatory Visit: Payer: Self-pay | Admitting: Emergency Medicine

## 2022-05-24 ENCOUNTER — Other Ambulatory Visit: Payer: Self-pay

## 2022-05-24 DIAGNOSIS — B379 Candidiasis, unspecified: Secondary | ICD-10-CM

## 2022-05-24 MED ORDER — FLUCONAZOLE 150 MG PO TABS: 150.0000 mg | ORAL_TABLET | Freq: Once | ORAL | 0 refills | Status: DC

## 2022-05-26 ENCOUNTER — Other Ambulatory Visit: Payer: Self-pay | Admitting: Neurology

## 2022-05-26 DIAGNOSIS — G43009 Migraine without aura, not intractable, without status migrainosus: Secondary | ICD-10-CM

## 2022-05-27 ENCOUNTER — Other Ambulatory Visit: Payer: Self-pay | Admitting: Neurology

## 2022-05-27 DIAGNOSIS — G43009 Migraine without aura, not intractable, without status migrainosus: Secondary | ICD-10-CM

## 2022-05-28 ENCOUNTER — Encounter: Payer: Self-pay | Admitting: *Deleted

## 2022-05-28 NOTE — Telephone Encounter (Signed)
Received additional clinical questions, sent patient my chart asking for details re: Rizatriptan trial.

## 2022-05-29 NOTE — Telephone Encounter (Signed)
Patient has read my my chart message but hasn't replied. Bernita Raisin PA pending her reply.

## 2022-05-29 NOTE — Telephone Encounter (Signed)
PA # [id number] started for qulipta 60g po q day.  ICD 10 G43.009. 24 hour pending (clinical).

## 2022-05-30 NOTE — Telephone Encounter (Signed)
Pa started to 24 hour pending (clinical review).  PA [id number].

## 2022-06-05 NOTE — Telephone Encounter (Signed)
Patient replied to my chart, rizatriptan caused extreme nausea. Bernita Raisin Pa completed faxed to [facility].

## 2022-06-06 ENCOUNTER — Encounter: Payer: Self-pay | Admitting: *Deleted

## 2022-06-06 NOTE — Telephone Encounter (Signed)
UBRELVY TAB 100MG , take 100 mg by mouth every 2 hours as needed, maximum 200mg  a day., is approved for 12 months through 06/06/2023. Reviewed by: Ph.

## 2022-06-13 ENCOUNTER — Encounter: Payer: Self-pay | Admitting: *Deleted

## 2022-06-13 NOTE — Telephone Encounter (Signed)
Called [facility] community plan to check [place] PA status. Then noted Toma Copier RN charted it was approved. Erling Conte with [employer] stated she does not see approval on her side. They did receive documents I faxed on 06/05/22 but the patient hasn't sent to them her signed Appeal Request Form for [place] to appeal for her. Sent my chart asking if she signed form and now has Rx.

## 2022-06-24 ENCOUNTER — Encounter: Payer: Self-pay | Admitting: Advanced Practice Midwife

## 2022-06-25 ENCOUNTER — Other Ambulatory Visit: Payer: Self-pay | Admitting: Emergency Medicine

## 2022-06-25 DIAGNOSIS — R300 Dysuria: Secondary | ICD-10-CM

## 2022-06-25 MED ORDER — PHENAZOPYRIDINE HCL 200 MG PO TABS: 200.0000 mg | ORAL_TABLET | Freq: Three times a day (TID) | ORAL | 1 refills | Status: DC | PRN

## 2022-06-25 MED ORDER — NITROFURANTOIN MONOHYD MACRO 100 MG PO CAPS: 100.0000 mg | ORAL_CAPSULE | Freq: Two times a day (BID) | ORAL | 0 refills | Status: DC

## 2022-06-25 NOTE — Progress Notes (Signed)
Rx for UTI symptoms sent, per protocol.

## 2022-07-02 ENCOUNTER — Telehealth: Payer: Self-pay | Admitting: Neurology

## 2022-07-02 NOTE — Telephone Encounter (Signed)
Madeline Freeman/ [employer] is calling to notify that if Pt don't send consent form back for appeal back by 8/5. Nehemiah Settle said she will have to withdraw appeal.

## 2022-07-09 ENCOUNTER — Telehealth: Payer: Self-pay | Admitting: *Deleted

## 2022-07-09 NOTE — Telephone Encounter (Signed)
Intiated PA for [facility] PA# [id number]. Denied 05-29-2022, due to not have tried the injectables. (Aimovig/Emgality).  She can not take due to fear of needles, vasovagal response.  Will appeal.   Faxed to [facility] , Appeals and [place] [phone] / fax [phone].  PA # [id number]. Fax confirmation received.  Called [phone] spoke to [place].  Have not received signed consent from Madeline Freeman and closed appeal (after mailing her form and they have not received).  They are to fax to me and I will let Madeline Freeman know and then they would open again per [facility].

## 2022-07-09 NOTE — Telephone Encounter (Signed)
Received the [facility] appeal consent form that needs to be signed by pt.  I LMVM for pt that will send to her on her email that we have listed [email or web address]. look in spam/junk if do not see in regular email.  This is for appeal on qulipta.  She is to call me back if questions or mychart. Needs to get back to to fax or she can fax to [facility].

## 2022-08-29 DIAGNOSIS — Z111 Encounter for screening for respiratory tuberculosis: Secondary | ICD-10-CM | POA: Diagnosis not present

## 2022-08-29 DIAGNOSIS — Z0184 Encounter for antibody response examination: Secondary | ICD-10-CM | POA: Diagnosis not present

## 2022-08-29 DIAGNOSIS — Z020 Encounter for examination for admission to educational institution: Secondary | ICD-10-CM | POA: Diagnosis not present

## 2022-09-12 ENCOUNTER — Ambulatory Visit (INDEPENDENT_AMBULATORY_CARE_PROVIDER_SITE_OTHER): Payer: Medicaid Other | Admitting: *Deleted

## 2022-09-12 VITALS — BP 128/78 | HR 120

## 2022-09-12 DIAGNOSIS — Z3201 Encounter for pregnancy test, result positive: Secondary | ICD-10-CM | POA: Diagnosis not present

## 2022-09-12 DIAGNOSIS — N912 Amenorrhea, unspecified: Secondary | ICD-10-CM

## 2022-09-12 LAB — POCT URINE PREGNANCY: Preg Test, Ur: POSITIVE — AB

## 2022-09-12 NOTE — Progress Notes (Signed)
Madeline Freeman presents today for UPT. She has no unusual complaints. LMP: 08/11/22    OBJECTIVE: Appears well, in no apparent distress.  OB History     Gravida  1   Para  0   Term  0   Preterm  0   AB  0   Living  0      SAB  0   IAB  0   Ectopic  0   Multiple  0   Live Births  0          Home UPT Result: positive In-Office UPT result: positive I have reviewed the patient's medical, obstetrical, social, and family histories, and medications.   ASSESSMENT: Positive pregnancy test  PLAN Prenatal care to be completed at: [facility]

## 2022-09-23 ENCOUNTER — Encounter: Payer: Self-pay | Admitting: Neurology

## 2022-09-23 ENCOUNTER — Telehealth (INDEPENDENT_AMBULATORY_CARE_PROVIDER_SITE_OTHER): Payer: Medicaid Other | Admitting: Neurology

## 2022-09-23 DIAGNOSIS — G43009 Migraine without aura, not intractable, without status migrainosus: Secondary | ICD-10-CM | POA: Insufficient documentation

## 2022-09-23 DIAGNOSIS — Z3A01 Less than 8 weeks gestation of pregnancy: Secondary | ICD-10-CM | POA: Insufficient documentation

## 2022-09-23 NOTE — Progress Notes (Signed)
[facility]    Provider:  Dr Lucia Gaskins Requesting Provider: No ref. provider found Primary Care Provider:  Patient, No Pcp Per  CC:  Migraines  Virtual Visit via Video Note  I connected with Madeline Freeman on 09/23/22 at  1:30 PM EDT by a video enabled telemedicine application and verified that I am speaking with the correct person using two identifiers.  Location: Patient: home Provider: office   I discussed the limitations of evaluation and management by telemedicine and the availability of in person appointments. The patient expressed understanding and agreed to proceed.  Follow Up Instructions:    I discussed the assessment and treatment plan with the patient. The patient was provided an opportunity to ask questions and all were answered. The patient agreed with the plan and demonstrated an understanding of the instructions.   The patient was advised to call back or seek an in-person evaluation if the symptoms worsen or if the condition fails to improve as anticipated.  I provided 20 minutes of non-face-to-face time during this encounter.   Anson Fret, MD   Follow up: She is pregnant. She is [redacted] weeks pregnant. She has really not had many migraines. She has been fine just taking tylenol. Excedrin (no ibuprofen or aspirin) helps. She is not taking anything. We discussed no med is entriely safe in pregnancy but 80% of women have great pregnancies and so far hers has been great. We talked about migraine in pregnancy, she can contact us for any problems.  Patient complains of symptoms per HPI as well as the following symptoms: pregnant . Pertinent negatives and positives per HPI. All others negative   HPI:  Madeline Freeman is a 21 y.o. female here as requested by No ref. provider found for migraines.  I reviewed Dr. 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  She has had migraines her whole life, father and father's mother. Typically it feels like it stars on the right side and spreads to the temples and behind the eyes, pulsating/pounding/throbbing/nausea and vomiting, she has 10 total headache days a month, photophobia/phonophobia, she can't move, has to be in a dark room, sounds will make it worse, excedrin only helps a little. She will get very dizzy and unstable. She has 6 migraine days a month that are moderate to severe and can last up to 24 hours but she had has one 3-4 days. No medication overuse. No aura. No other focal neurologic deficits, associated symptoms, inciting events or modifiable factors.  Reviewed notes, labs and imaging from outside physicians, which showed:   05/26/2014: CT head showed No acute intracranial abnormalities including mass lesion or mass effect, hydrocephalus, extra-axial fluid collection, midline shift, hemorrhage, or acute infarction, large ischemic events (personally reviewed images)    From a thorough review of records patient has tried: imitrex made her nauseated, propranolol, flexeril, etodolac, toradol, ibuprofen, tylenol, excedrin, lamictal, propranolol (she  had hypotension), amitriptyline (sedation/drowsiness), zoloft, effexor, topiramate, rizatriptan, Injectables contraindicated due to extreme fear of needles and vasovagal, nurtec  Review of Systems: Patient complains of symptoms per  HPI as well as the following symptoms migraines. Pertinent negatives and positives per HPI. All others negative.   Social History   Socioeconomic History   Marital status: Single    Spouse name: Not on file   Number of children: 0   Years of education: Not on file   Highest education level: Some college, no degree  Occupational History   Not on file  Tobacco Use   Smoking status: Never   Smokeless tobacco: Never  Vaping Use   Vaping Use: Every day  Substance and Sexual Activity   Alcohol use: Yes    Comment: occ/ social   Drug use: No   Sexual activity: Yes    Birth control/protection: None  Other Topics Concern   Not on file  Social History Narrative   Caffeine coffee in am,  soda pm   Education some college   Works for [employer].    Social Determinants of Health   Financial Resource Strain: Low Risk  (06/02/2018)   Overall Financial Resource Strain (CARDIA)    Difficulty of Paying Living Expenses: Not hard at all  Food Insecurity: No Food Insecurity (06/02/2018)   Hunger Vital Sign    Worried About Running Out of Food in the Last Year: Never true    Ran Out of Food in the Last Year: Never true  Transportation Needs: No Transportation Needs (06/02/2018)   PRAPARE - Administrator, Civil Service (Medical): No    Lack of Transportation (Non-Medical): No  Physical Activity: Inactive (06/02/2018)   Exercise Vital Sign    Days of Exercise per Week: 0 days    Minutes of Exercise per Session: 0 min  Stress: Stress Concern Present (06/02/2018)   [employer] of Occupational Health - Occupational Stress Questionnaire    Feeling of Stress : Very much  Social Connections: Somewhat Isolated (06/02/2018)   Social Connection and Isolation Panel [NHANES]    Frequency of Communication with Friends and Family: More than three times a week    Frequency of Social Gatherings with Friends and Family: Three times a week    Attends Religious Services: More than 4 times per year     Active Member of Clubs or Organizations: No    Attends Banker Meetings: Never    Marital Status: Never married  Intimate Partner Violence: Not At Risk (06/02/2018)   Humiliation, Afraid, Rape, and Kick questionnaire    Fear of Current or Ex-Partner: No    Emotionally Abused: No    Physically Abused: No    Sexually Abused: No    Family History  Problem Relation Age of Onset   Endometriosis Mother    Bipolar disorder Mother    Anxiety disorder Mother    Depression Mother    Migraines Father    Anxiety disorder Father    Depression Father    Alcohol abuse Father    Depression Sister    Anxiety disorder Sister    Sexual abuse Sister    Migraines Maternal Grandmother    Cancer Maternal Grandmother    Depression Maternal Grandmother     Past Medical History:  Diagnosis Date   Anxiety    Depression    Migraines     Patient Active Problem List   Diagnosis Date Noted   Migraine without aura and  without status migrainosus, not intractable 09/23/2022   Less than [redacted] weeks gestation of pregnancy 09/23/2022   Chronic migraine without aura with status migrainosus, not intractable 05/22/2022   Acute cholecystitis 09/19/2021   Cholecystitis, acute with cholelithiasis 09/19/2021   Dyspareunia in female 09/20/2019   Depo-Provera contraceptive status 11/30/2018   Scoliosis 11/30/2018    Past Surgical History:  Procedure Laterality Date   CHOLECYSTECTOMY N/A 09/19/2021   Procedure: LAPAROSCOPIC CHOLECYSTECTOMY WITH INTRAOPERATIVE CHOLANGIOGRAM;  Surgeon: Quentin Ore, MD;  Location: [facility] OR;  Service: General;  Laterality: N/A;   WISDOM TOOTH EXTRACTION  2018    Current Outpatient Medications  Medication Sig Dispense Refill   ondansetron (ZOFRAN-ODT) 4 MG disintegrating tablet Take 1-2 tablets (4-8 mg total) by mouth every 8 (eight) hours as needed. 30 tablet 3   phenazopyridine (PYRIDIUM) 200 MG tablet Take 1 tablet (200 mg total) by mouth 3 (three) times  daily as needed for pain (urethral spasm). 10 tablet 1   No current facility-administered medications for this visit.    Allergies as of 09/23/2022 - Review Complete 09/12/2022  Allergen Reaction Noted   Rizatriptan Nausea Only 06/05/2022    Vitals: LMP 08/11/2022 (Exact Date)  Last Weight:  Wt Readings from Last 1 Encounters:  05/22/22 122 lb (55.3 kg)   Last Height:   Ht Readings from Last 1 Encounters:  05/22/22 5' (1.524 m)    Physical exam: Exam: Gen: NAD, conversant      CV:  Denies palpitations or chest pain or SOB. VS: Breathing at a normal rate. Weight appears within normal limits. Not febrile. Eyes: Conjunctivae clear without exudates or hemorrhage  Neuro: Detailed Neurologic Exam  Speech:    Speech is normal; fluent and spontaneous with normal comprehension.  Cognition:    The patient is oriented to person, place, and time;     recent and remote memory intact;     language fluent;     normal attention, concentration,     fund of knowledge Cranial Nerves:    The pupils are equal, round, and reactive to light. Visual fields are full . Extraocular movements are intact.  The face is symmetric with normal sensation. The palate elevates in the midline. Hearing intact. Voice is normal. Shoulder shrug is normal.  Motor Observation:   no involuntary movements noted. Tone:    Appears normal  Posture:    Posture is normal. normal erect    Strength:    Strength is anti-gravity and symmetric in the upper and lower limbs.        Assessment/Plan:  Patient with migraines, we had a long conversation about options: she is [redacted] weeks pregnant, doing well, she will contact us for worsening.  Tried to get [place] daily prior to pregnancy: - couldn't get it because she hadn't tried an injectable which is good because now she is pregnant [redacted] week  We discussed pregnancy, no medication is entirely sage, 8-% of migraineurs do great in pregnancy, some of the afer medications  include tylenol ,reglan, tylenol wth codeine (for severe cases), periactin, propranolol, nortriptyline and sumatriptan after the first trimester. She will call us if needed.    From a thorough review of records patient has tried: imitrex made her nauseated, propranolol, flexeril, etodolac, toradol, ibuprofen, tylenol, excedrin, lamictal, propranolol (she had hypotension), amitriptyline (sedation/drowsiness), zoloft, effexor, topiramate, rizatriptan, Ajovy/Emgality Injectables contraindicated due to extreme fear of needles and vasovagal syncope, nurtec, ubrelvy   Discussed There are limited treatments in pregnancy and all have risks.  Since the patient's headaches are infrequent can recommend percocet very sparingly at the start of the headache. Tylenol is generally accepted as safe in pregnancy but even that carries risks and recent literature with warnings. Thins to try:  Cool Compress. Lie down and place a cool compress on your head.  Avoid headache triggers. If certain foods or odors seem to have triggered your migraines in the past, avoid them. A headache diary might help you identify triggers.  Include physical activity in your daily routine. Try a daily walk or other moderate aerobic exercise.  Manage stress. Find healthy ways to cope with the stressors, such as delegating tasks on your to-do list.  Practice relaxation techniques. Try deep breathing, yoga, massage and visualization.  Eat regularly. Eating regularly scheduled meals and maintaining a healthy diet might help prevent headaches. Also, drink plenty of fluids.  Follow a regular sleep schedule. Sleep deprivation might contribute to headaches  Consider biofeedback. With this mind-body technique, you learn to control certain bodily functions -- such as muscle tension, heart rate and blood pressure -- to prevent headaches or reduce headache pain.  Headaches during pregnancy are common. However, if you develop a severe headache or a  headache that doesn't go away, call your health care provider. Severe headaches can be a sign of a pregnancy complication   No orders of the defined types were placed in this encounter.  No orders of the defined types were placed in this encounter.   Cc: No ref. provider found,  Patient, No Pcp Per  Sarina Ill, MD  [facility] [address] [place],  [address]  Phone [phone] Fax [phone]  I spent 20 minutes of face-to-face and non-face-to-face time with patient on the  1. Migraine without aura and without status migrainosus, not intractable   2. Less than [redacted] weeks gestation of pregnancy    diagnosis.  This included previsit chart review, lab review, study review, order entry, electronic health record documentation, patient education on the different diagnostic and therapeutic options, counseling and coordination of care, risks and benefits of management, compliance, or risk factor reduction

## 2022-09-25 ENCOUNTER — Ambulatory Visit (INDEPENDENT_AMBULATORY_CARE_PROVIDER_SITE_OTHER): Payer: Medicaid Other

## 2022-09-25 ENCOUNTER — Other Ambulatory Visit (HOSPITAL_COMMUNITY)
Admission: RE | Admit: 2022-09-25 | Discharge: 2022-09-25 | Disposition: A | Discharge status: Home / Self Care | Payer: Medicaid Other | Source: Ambulatory Visit | Attending: Obstetrics and Gynecology | Admitting: Obstetrics and Gynecology

## 2022-09-25 DIAGNOSIS — N898 Other specified noninflammatory disorders of vagina: Secondary | ICD-10-CM | POA: Diagnosis not present

## 2022-09-25 DIAGNOSIS — O26891 Other specified pregnancy related conditions, first trimester: Secondary | ICD-10-CM | POA: Insufficient documentation

## 2022-09-25 DIAGNOSIS — B9689 Other specified bacterial agents as the cause of diseases classified elsewhere: Secondary | ICD-10-CM

## 2022-09-25 MED ORDER — METRONIDAZOLE 500 MG PO TABS: 500.0000 mg | ORAL_TABLET | Freq: Two times a day (BID) | ORAL | 0 refills | Status: DC

## 2022-09-25 NOTE — Progress Notes (Signed)
SUBJECTIVE:  21 y.o. female complains of white, malodorous, and thin vaginal discharge for 3 day(s). Denies abnormal vaginal bleeding or significant pelvic pain or fever. No UTI symptoms. Denies history of known exposure to STD.  Patient's last menstrual period was 08/11/2022 (exact date).  OBJECTIVE:  She appears well, afebrile. Urine dipstick: not done.  ASSESSMENT:  Vaginal Discharge  Vaginal Odor   PLAN:  GC, chlamydia, trichomonas, BVAG, CVAG probe sent to lab. Treatment: To be determined once lab results are received ROV prn if symptoms persist or worsen.

## 2022-09-25 NOTE — Progress Notes (Signed)
Agree with nurses's documentation of this patient's clinic encounter.  Maitlyn Penza L, MD  

## 2022-09-26 ENCOUNTER — Other Ambulatory Visit: Payer: Self-pay | Admitting: *Deleted

## 2022-09-26 DIAGNOSIS — B379 Candidiasis, unspecified: Secondary | ICD-10-CM

## 2022-09-26 LAB — CERVICOVAGINAL ANCILLARY ONLY
Bacterial Vaginitis (gardnerella): POSITIVE — AB
Candida Glabrata: NEGATIVE
Candida Vaginitis: POSITIVE — AB
Comment: NEGATIVE
Comment: NEGATIVE
Comment: NEGATIVE

## 2022-09-26 MED ORDER — TERCONAZOLE 0.8 % VA CREA: 1.0000 | TOPICAL_CREAM | Freq: Every day | VAGINAL | 0 refills | Status: DC

## 2022-09-26 NOTE — Progress Notes (Signed)
TC. No answer. Left VM. RX Flagyl was sent on 09/25/22 for BV. RX for terazol sent today for yeast. MyChart message with education sent.

## 2022-10-10 ENCOUNTER — Ambulatory Visit (INDEPENDENT_AMBULATORY_CARE_PROVIDER_SITE_OTHER): Payer: Medicaid Other

## 2022-10-10 VITALS — BP 127/72 | HR 89 | Ht 60.0 in | Wt 133.8 lb

## 2022-10-10 DIAGNOSIS — O3680X0 Pregnancy with inconclusive fetal viability, not applicable or unspecified: Secondary | ICD-10-CM | POA: Diagnosis not present

## 2022-10-10 DIAGNOSIS — Z3401 Encounter for supervision of normal first pregnancy, first trimester: Secondary | ICD-10-CM | POA: Insufficient documentation

## 2022-10-10 DIAGNOSIS — B379 Candidiasis, unspecified: Secondary | ICD-10-CM

## 2022-10-10 DIAGNOSIS — O200 Threatened abortion: Secondary | ICD-10-CM | POA: Diagnosis not present

## 2022-10-10 MED ORDER — TERCONAZOLE 0.4 % VA CREA: 1.0000 | TOPICAL_CREAM | Freq: Every day | VAGINAL | 0 refills | Status: DC

## 2022-10-10 NOTE — Progress Notes (Signed)
Patient was assessed and managed by nursing staff during this encounter. I have reviewed the chart and agree with the documentation and plan. I have also made any necessary editorial changes.  Shawniece Oyola A Elnoria Livingston, MD 10/10/2022 11:48 AM   

## 2022-10-10 NOTE — Progress Notes (Signed)
New OB Intake  I connected with Madeline Freeman on 10/10/22 at 10:15 AM EST by in person and verified that I am speaking with the correct person using two identifiers. Nurse is located at [facility] and pt is located at [place].  I discussed the limitations, risks, security and privacy concerns of performing an evaluation and management service by telephone and the availability of in person appointments. I also discussed with the patient that there may be a patient responsible charge related to this service. The patient expressed understanding and agreed to proceed.  I explained I am completing New OB Intake today. We discussed EDD TBD at this time. Only GS and YS seen on u/s. Follow up u/s scheduled. Pt is G1/P0. I reviewed her allergies, medications, Medical/Surgical/OB history, and appropriate screenings. I informed her of [id number] services. [facility] information placed in AVS. Based on history, this is a low risk pregnancy.  Patient Active Problem List   Diagnosis Date Noted   Migraine without aura and without status migrainosus, not intractable 09/23/2022   Less than [redacted] weeks gestation of pregnancy 09/23/2022   Chronic migraine without aura with status migrainosus, not intractable 05/22/2022   Acute cholecystitis 09/19/2021   Cholecystitis, acute with cholelithiasis 09/19/2021   Dyspareunia in female 09/20/2019   Depo-Provera contraceptive status 11/30/2018   Scoliosis 11/30/2018    Concerns addressed today  Delivery Plans Plans to deliver [facility]. Patient given information for [facility] Healthy Baby website for more information about [facility]. Patient is not interested in water birth. Offered upcoming OB visit with CNM to discuss further.  MyChart/Babyscripts MyChart access verified. I explained pt will have some visits in office and some virtually. Babyscripts instructions given and order placed. Patient verifies receipt of registration text/e-mail. Account successfully created  and app downloaded.  Blood Pressure Cuff/Weight Scale Patient has access to BP cuff at home. Explained after first prenatal appt pt will check weekly and document in Babyscripts. Patient does have weight scale.  Anatomy US Explained first scheduled [place] will be around 19 weeks. Dating and viability u/s performed today. Anatomy US TBD.  Labs Discussed Avelina Laine genetic screening with patient. Would like both Panorama and Horizon drawn at new OB visit. Routine prenatal labs needed.  COVID Vaccine Patient has not had COVID vaccine.   Is patient a CenteringPregnancy candidate?  Declined Declined due to Group setting Not a candidate due to  If accepted,   Social Determinants of Health Food Insecurity: Patient denies food insecurity. WIC Referral: Patient is interested in referral to [facility].  Transportation: Patient denies transportation needs. Childcare: Discussed no children allowed at ultrasound appointments. Offered childcare services; patient declines childcare services at this time.  First visit review I reviewed new OB appt with patient. I explained they will have a provider visit that includes prenatal labs, std screening, pap smear, genetic screening, and discuss plan of care for pregnancy. Explained pt will be seen by Nettie Elm at first visit; encounter routed to appropriate provider. Explained that patient will be seen by pregnancy navigator following visit with provider.   Hamilton Capri, RN 10/10/2022  10:13 AM

## 2022-10-11 LAB — BETA HCG QUANT (REF LAB): hCG Quant: 25219 m[IU]/mL

## 2022-10-21 ENCOUNTER — Ambulatory Visit
Admission: RE | Admit: 2022-10-21 | Discharge: 2022-10-21 | Disposition: A | Discharge status: Home / Self Care | Payer: Medicaid Other | Source: Ambulatory Visit | Attending: Obstetrics and Gynecology | Admitting: Obstetrics and Gynecology

## 2022-10-21 ENCOUNTER — Ambulatory Visit (INDEPENDENT_AMBULATORY_CARE_PROVIDER_SITE_OTHER): Payer: Medicaid Other | Admitting: Obstetrics and Gynecology

## 2022-10-21 ENCOUNTER — Encounter: Payer: Self-pay | Admitting: Obstetrics and Gynecology

## 2022-10-21 DIAGNOSIS — O021 Missed abortion: Secondary | ICD-10-CM

## 2022-10-21 DIAGNOSIS — O26891 Other specified pregnancy related conditions, first trimester: Secondary | ICD-10-CM | POA: Diagnosis not present

## 2022-10-21 DIAGNOSIS — O208 Other hemorrhage in early pregnancy: Secondary | ICD-10-CM | POA: Diagnosis not present

## 2022-10-21 DIAGNOSIS — Z3A01 Less than 8 weeks gestation of pregnancy: Secondary | ICD-10-CM | POA: Diagnosis not present

## 2022-10-21 DIAGNOSIS — O3680X0 Pregnancy with inconclusive fetal viability, not applicable or unspecified: Secondary | ICD-10-CM | POA: Diagnosis not present

## 2022-10-21 NOTE — Progress Notes (Signed)
Pt seen for OB U/S results Failed pregnancy by U/S today Results reviewed with pt and FOB Management options reviewed with pt to include, conservative therapy, medication and surgery. Pt undecided at this time Will check Type and Rh today Bleeding precautions reviewed with pt Pt instructed to contact [facility] office with Tx choice and or F/U appt in 2 weeks if no bleeding occurs.  Condolences offered during visit.

## 2022-10-22 LAB — ABO AND RH: Rh Factor: POSITIVE

## 2022-10-23 ENCOUNTER — Ambulatory Visit (INDEPENDENT_AMBULATORY_CARE_PROVIDER_SITE_OTHER): Payer: Medicaid Other

## 2022-10-23 DIAGNOSIS — N898 Other specified noninflammatory disorders of vagina: Secondary | ICD-10-CM

## 2022-10-23 DIAGNOSIS — B379 Candidiasis, unspecified: Secondary | ICD-10-CM

## 2022-10-23 MED ORDER — TERCONAZOLE 0.8 % VA CREA: 1.0000 | TOPICAL_CREAM | Freq: Every day | VAGINAL | 0 refills | Status: DC

## 2022-10-23 NOTE — Progress Notes (Signed)
SUBJECTIVE:  21 y.o. female complains of vaginal discharge and itching. Patient is currently bleeding from a missed AB for 1 week(s). Denies abnormal vaginal bleeding or significant pelvic pain or fever. No UTI symptoms. Denies history of known exposure to STD.  No LMP recorded.  OBJECTIVE:  She appears well, afebrile. Urine dipstick: not done.  ASSESSMENT:  Vaginal Discharge  Vaginal Odor   PLAN:  Treatment for yeast infection sent to pharmacy per protocol. Patient defer self swab today. Terconazole sent to pharmacy.

## 2022-10-29 ENCOUNTER — Emergency Department (HOSPITAL_BASED_OUTPATIENT_CLINIC_OR_DEPARTMENT_OTHER)
Admission: EM | Admit: 2022-10-29 | Discharge: 2022-10-29 | Disposition: A | Discharge status: Home / Self Care | Payer: 59 | Attending: Emergency Medicine | Admitting: Emergency Medicine

## 2022-10-29 ENCOUNTER — Emergency Department (HOSPITAL_BASED_OUTPATIENT_CLINIC_OR_DEPARTMENT_OTHER): Payer: 59

## 2022-10-29 DIAGNOSIS — O034 Incomplete spontaneous abortion without complication: Secondary | ICD-10-CM | POA: Insufficient documentation

## 2022-10-29 DIAGNOSIS — O209 Hemorrhage in early pregnancy, unspecified: Secondary | ICD-10-CM | POA: Diagnosis not present

## 2022-10-29 DIAGNOSIS — Z3A01 Less than 8 weeks gestation of pregnancy: Secondary | ICD-10-CM | POA: Insufficient documentation

## 2022-10-29 DIAGNOSIS — O364XX0 Maternal care for intrauterine death, not applicable or unspecified: Secondary | ICD-10-CM | POA: Diagnosis not present

## 2022-10-29 DIAGNOSIS — N939 Abnormal uterine and vaginal bleeding, unspecified: Secondary | ICD-10-CM | POA: Diagnosis not present

## 2022-10-29 LAB — CBC WITH DIFFERENTIAL/PLATELET
Abs Immature Granulocytes: 0.03 10*3/uL (ref 0.00–0.07)
Basophils Absolute: 0.1 10*3/uL (ref 0.0–0.1)
Basophils Relative: 1 %
Eosinophils Absolute: 0.1 10*3/uL (ref 0.0–0.5)
Eosinophils Relative: 1 %
HCT: 39.8 % (ref 36.0–46.0)
Hemoglobin: 14.2 g/dL (ref 12.0–15.0)
Immature Granulocytes: 0 %
Lymphocytes Relative: 29 %
Lymphs Abs: 3.4 10*3/uL (ref 0.7–4.0)
MCH: 30.1 pg (ref 26.0–34.0)
MCHC: 35.7 g/dL (ref 30.0–36.0)
MCV: 84.3 fL (ref 80.0–100.0)
Monocytes Absolute: 0.7 10*3/uL (ref 0.1–1.0)
Monocytes Relative: 6 %
Neutro Abs: 7.8 10*3/uL — ABNORMAL HIGH (ref 1.7–7.7)
Neutrophils Relative %: 63 %
Platelets: 384 10*3/uL (ref 150–400)
RBC: 4.72 MIL/uL (ref 3.87–5.11)
RDW: 11.9 % (ref 11.5–15.5)
WBC: 12 10*3/uL — ABNORMAL HIGH (ref 4.0–10.5)
nRBC: 0 % (ref 0.0–0.2)

## 2022-10-29 LAB — HCG, QUANTITATIVE, PREGNANCY: hCG, Beta Chain, Quant, S: 8960 m[IU]/mL — ABNORMAL HIGH (ref <5)

## 2022-10-29 MED ORDER — IBUPROFEN 800 MG PO TABS: 800.0000 mg | ORAL_TABLET | Freq: Three times a day (TID) | ORAL | 0 refills | Status: DC | PRN

## 2022-10-29 MED ORDER — KETOROLAC TROMETHAMINE 15 MG/ML IJ SOLN
15.0000 mg | Freq: Once | INTRAMUSCULAR | Status: AC
  Administered 2022-10-29: 15 mg via INTRAVENOUS
  Filled 2022-10-29: qty 1

## 2022-10-29 NOTE — ED Notes (Signed)
Late entry -- Pt has returned from u/s  - no acute changes

## 2022-10-29 NOTE — ED Triage Notes (Signed)
Pt is approx 6-[redacted] weeks pregnant and knowingly miscarrying after [place] at OB-GYN on 10/21/22. Pt's bleeding and cramping has increased x 45 mins.

## 2022-10-29 NOTE — ED Notes (Signed)
Pt ambulatory at this time to u/s escorted by u/s tech

## 2022-10-29 NOTE — ED Provider Notes (Addendum)
[facility] Provider Note: Madeline Dell, MD, FACEP  CSN: [id number] MRN: [id number] ARRIVAL: 10/29/22 at 0229 ROOM: DB009/DB009   CHIEF COMPLAINT  Vaginal Bleeding   HISTORY OF PRESENT ILLNESS  10/29/22 2:57 AM Madeline Freeman is a 21 y.o. female who is about 5-[redacted] weeks pregnant with a known intrauterine fetal demise.  She has been bleeding since 10/21/2022.  Her bleeding became more severe about an hour ago with severe uterine cramping which she rates as a 10 out of 10.  She passed either a large clot or clot mixed with tissue prior to arrival.  Her blood type is A-positive.   Past Medical History:  Diagnosis Date   Anxiety    Depression    Migraines     Past Surgical History:  Procedure Laterality Date   CHOLECYSTECTOMY N/A 09/19/2021   Procedure: LAPAROSCOPIC CHOLECYSTECTOMY WITH INTRAOPERATIVE CHOLANGIOGRAM;  Surgeon: Stechschulte, Hyman Hopes, MD;  Location: [facility];  Service: General;  Laterality: N/A;   WISDOM TOOTH EXTRACTION  2018    Family History  Problem Relation Age of Onset   Migraines Mother    Endometriosis Mother    Bipolar disorder Mother    Anxiety disorder Mother    Depression Mother    Migraines Father    Anxiety disorder Father    Depression Father    Alcohol abuse Father    Depression Sister    Anxiety disorder Sister    Sexual abuse Sister    Migraines Maternal Grandmother    Depression Maternal Grandmother    Breast cancer Maternal Grandmother    Ovarian cancer Maternal Grandmother    Migraines Paternal Grandmother     Social History   Tobacco Use   Smoking status: Never   Smokeless tobacco: Never  Vaping Use   Vaping Use: Former   Devices: not since confirmed pregnancy  Substance Use Topics   Alcohol use: Not Currently    Comment: occ/ social, not since confirmed pregnancy   Drug use: No    Prior to Admission medications   Medication Sig Start Date End Date Taking? Authorizing Provider  ibuprofen (ADVIL) 800 MG tablet  Take 1 tablet (800 mg total) by mouth every 8 (eight) hours as needed for cramping. 10/29/22  Yes Volney Reierson, MD  terconazole (TERAZOL 3) 0.8 % vaginal cream Place 1 applicator vaginally at bedtime. 10/23/22   Brock Bad, MD    Allergies Rizatriptan   REVIEW OF SYSTEMS  Negative except as noted here or in the History of Present Illness.   PHYSICAL EXAMINATION  Initial Vital Signs Blood pressure (!) 151/95, pulse 97, temperature 98.7 F (37.1 C), temperature source Oral, resp. rate 18, height 5' (1.524 m), weight 61.2 kg, last menstrual period 08/11/2022, SpO2 100 %, unknown if currently breastfeeding.  Examination General: Well-developed, well-nourished female in no acute distress; appearance consistent with age of record HENT: normocephalic; atraumatic Eyes: Normal appearance Neck: supple Heart: regular rate and rhythm Lungs: clear to auscultation bilaterally Abdomen: soft; nondistended; suprapubic tenderness; bowel sounds present GU: Normal external genitalia; vaginal bleeding; cervical os closed; large clot or products of conception in vaginal vault, removed with ring forceps and sent for pathology Extremities: No deformity; full range of motion; pulses normal Neurologic: Awake, alert and oriented; motor function intact in all extremities and symmetric; no facial droop Skin: Warm and dry Psychiatric: Normal mood and affect   RESULTS  Summary of this visit's results, reviewed and interpreted by myself:   EKG Interpretation  Date/Time:  Ventricular Rate:    PR Interval:    QRS Duration:   QT Interval:    QTC Calculation:   R Axis:     Text Interpretation:         Laboratory Studies: Results for orders placed or performed during the hospital encounter of 10/29/22 (from the past 24 hour(s))  CBC with Differential     Status: Abnormal   Collection Time: 10/29/22  2:55 AM  Result Value Ref Range   WBC 12.0 (H) 4.0 - 10.5 K/uL   RBC 4.72 3.87 - 5.11  MIL/uL   Hemoglobin 14.2 12.0 - 15.0 g/dL   HCT 98.3 38.2 - 50.5 %   MCV 84.3 80.0 - 100.0 fL   MCH 30.1 26.0 - 34.0 pg   MCHC 35.7 30.0 - 36.0 g/dL   RDW 39.7 67.3 - 41.9 %   Platelets 384 150 - 400 K/uL   nRBC 0.0 0.0 - 0.2 %   Neutrophils Relative % 63 %   Neutro Abs 7.8 (H) 1.7 - 7.7 K/uL   Lymphocytes Relative 29 %   Lymphs Abs 3.4 0.7 - 4.0 K/uL   Monocytes Relative 6 %   Monocytes Absolute 0.7 0.1 - 1.0 K/uL   Eosinophils Relative 1 %   Eosinophils Absolute 0.1 0.0 - 0.5 K/uL   Basophils Relative 1 %   Basophils Absolute 0.1 0.0 - 0.1 K/uL   Immature Granulocytes 0 %   Abs Immature Granulocytes 0.03 0.00 - 0.07 K/uL  hCG, quantitative, pregnancy     Status: Abnormal   Collection Time: 10/29/22  2:55 AM  Result Value Ref Range   hCG, Beta Chain, Quant, S 8,960 (H) <5 mIU/mL   Imaging Studies: US OB LESS THAN 14 WEEKS WITH OB TRANSVAGINAL  Result Date: 10/29/2022 CLINICAL DATA:  Vaginal bleeding. EXAM: OBSTETRIC <14 WK [place] AND TRANSVAGINAL OB US TECHNIQUE: Both transabdominal and transvaginal ultrasound examinations were performed for complete evaluation of the gestation as well as the maternal uterus, adnexal regions, and pelvic cul-de-sac. Transvaginal technique was performed to assess early pregnancy. COMPARISON:  October 21, 2022 FINDINGS: Intrauterine gestational sac: Single, located within the cervical canal Yolk sac:  Visualized and irregular in appearance. Embryo:  Not Visualized. Cardiac Activity: Not Visualized. Heart Rate: N/A  bpm MSD: 12.9 mm   6 w   1 d Subchorionic hemorrhage:  Small Maternal uterus/adnexae: The right ovary measures 2.7 cm x 1.7 cm x 1.7 cm and is normal in appearance. The left ovary measures 3.2 cm x 1.8 cm x 1.9 cm and is normal in appearance. No pelvic free fluid is seen. IMPRESSION: Single gestational sac, containing and irregular appearing yolk sac, located within the endometrial canal, concerning for a failed pregnancy. Recommend follow-up US  in 10-14 days for definitive diagnosis. This recommendation follows SRU consensus guidelines: Diagnostic Criteria for Nonviable Pregnancy Early in the First Trimester. Malva Limes Med 2013; 379:0240-97. Electronically Signed   By: Aram Candela M.D.   On: 10/29/2022 03:37    ED COURSE and MDM  Nursing notes, initial and subsequent vitals signs, including pulse oximetry, reviewed and interpreted by myself.  Vitals:   10/29/22 0253 10/29/22 0255 10/29/22 0425  BP:  (!) 151/95 (!) 140/86  Pulse: 97  88  Resp: 18  18  Temp: 98.7 F (37.1 C)  98.5 F (36.9 C)  TempSrc: Oral  Oral  SpO2: 100%  99%  Weight:  61.2 kg   Height:  5' (1.524 m)  Medications  ketorolac (TORADOL) 15 MG/ML injection 15 mg (15 mg Intravenous Given 10/29/22 0259)   3:46 AM Pain down to a 2 or 3 out of 10 after IV Toradol.  Ultrasound results consistent with an incomplete miscarriage.  Quantitative hCG is down from 25,219 on 09/09/2022.  Her blood type is A, Rh-positive, Rhophylac not indicated.  Patient will contact her OB/GYN later this morning regarding ultrasound findings and the possible need for D&C if she does not complete the miscarriage on her own.   PROCEDURES  Procedures   ED DIAGNOSES     ICD-10-CM   1. Incomplete miscarriage  O03.4          Marinda Tyer, MD 10/29/22 0356    Paula Libra, MD 10/29/22 0439    Paula Libra, MD 10/29/22 [phone]

## 2022-10-30 LAB — SURGICAL PATHOLOGY

## 2022-11-01 ENCOUNTER — Encounter: Payer: Medicaid Other | Admitting: Obstetrics and Gynecology

## 2022-11-06 ENCOUNTER — Ambulatory Visit: Payer: Medicaid Other | Admitting: Obstetrics and Gynecology

## 2022-11-08 ENCOUNTER — Ambulatory Visit (INDEPENDENT_AMBULATORY_CARE_PROVIDER_SITE_OTHER): Payer: Medicaid Other | Admitting: Obstetrics

## 2022-11-08 ENCOUNTER — Encounter: Payer: Self-pay | Admitting: Obstetrics

## 2022-11-08 ENCOUNTER — Other Ambulatory Visit: Payer: Self-pay | Admitting: Obstetrics

## 2022-11-08 ENCOUNTER — Other Ambulatory Visit (HOSPITAL_COMMUNITY)
Admission: RE | Admit: 2022-11-08 | Discharge: 2022-11-08 | Disposition: A | Discharge status: Home / Self Care | Payer: Medicaid Other | Source: Ambulatory Visit | Attending: Obstetrics and Gynecology | Admitting: Obstetrics and Gynecology

## 2022-11-08 VITALS — BP 119/82 | HR 86 | Wt 137.0 lb

## 2022-11-08 DIAGNOSIS — O039 Complete or unspecified spontaneous abortion without complication: Secondary | ICD-10-CM | POA: Diagnosis not present

## 2022-11-08 DIAGNOSIS — B9689 Other specified bacterial agents as the cause of diseases classified elsewhere: Secondary | ICD-10-CM | POA: Diagnosis not present

## 2022-11-08 DIAGNOSIS — N898 Other specified noninflammatory disorders of vagina: Secondary | ICD-10-CM | POA: Diagnosis not present

## 2022-11-08 DIAGNOSIS — B379 Candidiasis, unspecified: Secondary | ICD-10-CM

## 2022-11-08 DIAGNOSIS — N760 Acute vaginitis: Secondary | ICD-10-CM | POA: Diagnosis not present

## 2022-11-08 DIAGNOSIS — O021 Missed abortion: Secondary | ICD-10-CM | POA: Diagnosis not present

## 2022-11-08 MED ORDER — TERCONAZOLE 0.8 % VA CREA: 1.0000 | TOPICAL_CREAM | Freq: Every day | VAGINAL | 2 refills | Status: DC

## 2022-11-08 MED ORDER — METRONIDAZOLE 1 % EX GEL: Freq: Every day | CUTANEOUS | 2 refills | Status: DC

## 2022-11-08 NOTE — Progress Notes (Signed)
Patient ID: Madeline Freeman, female   DOB: May 16, 2001, 21 y.o.   MRN: [id number]  Chief Complaint  Patient presents with   Miscarriage    HPI Madeline Freeman is a 21 y.o. female.  History of SAB on 10-29-2022.  Presents today for follow up. No complaints.  Denies vaginal bleeding or pelvic pain.  She does c/o a malodorous vaginal discharge. HPI  Past Medical History:  Diagnosis Date   Anxiety    Depression    Migraines     Past Surgical History:  Procedure Laterality Date   CHOLECYSTECTOMY N/A 09/19/2021   Procedure: LAPAROSCOPIC CHOLECYSTECTOMY WITH INTRAOPERATIVE CHOLANGIOGRAM;  Surgeon: Quentin Ore, MD;  Location: [facility];  Service: General;  Laterality: N/A;   WISDOM TOOTH EXTRACTION  2018    Family History  Problem Relation Age of Onset   Migraines Mother    Endometriosis Mother    Bipolar disorder Mother    Anxiety disorder Mother    Depression Mother    Migraines Father    Anxiety disorder Father    Depression Father    Alcohol abuse Father    Depression Sister    Anxiety disorder Sister    Sexual abuse Sister    Migraines Maternal Grandmother    Depression Maternal Grandmother    Breast cancer Maternal Grandmother    Ovarian cancer Maternal Grandmother    Migraines Paternal Grandmother     Social History Social History   Tobacco Use   Smoking status: Never    Passive exposure: Never   Smokeless tobacco: Never  Vaping Use   Vaping Use: Former   Devices: not since confirmed pregnancy  Substance Use Topics   Alcohol use: Not Currently    Comment: occ/ social, not since confirmed pregnancy   Drug use: No    Allergies  Allergen Reactions   Rizatriptan Nausea Only    Current Outpatient Medications  Medication Sig Dispense Refill   ibuprofen (ADVIL) 800 MG tablet Take 1 tablet (800 mg total) by mouth every 8 (eight) hours as needed for cramping. 21 tablet 0   terconazole (TERAZOL 3) 0.8 % vaginal cream Place 1 applicator vaginally at  bedtime. (Patient not taking: Reported on 11/08/2022) 20 g 0   No current facility-administered medications for this visit.    Review of Systems Review of Systems Constitutional: negative for fatigue and weight loss Respiratory: negative for cough and wheezing Cardiovascular: negative for chest pain, fatigue and palpitations Gastrointestinal: negative for abdominal pain and change in bowel habits Genitourinary:positive for malodorous vaginal discharge Integument/breast: negative for nipple discharge Musculoskeletal:negative for myalgias Neurological: negative for gait problems and tremors Behavioral/Psych: negative for abusive relationship, depression Endocrine: negative for temperature intolerance      Blood pressure 119/82, pulse 86, weight 137 lb (62.1 kg), last menstrual period 08/11/2022, not currently breastfeeding.  Physical Exam Physical Exam General:   Alert and no distress  Skin:   no rash or abnormalities  Lungs:   clear to auscultation bilaterally  Heart:   regular rate and rhythm, S1, S2 normal, no murmur, click, rub or gallop  Breasts:   normal without suspicious masses, skin or nipple changes or axillary nodes  Abdomen:  normal findings: no organomegaly, soft, non-tender and no hernia  Pelvis:  External genitalia: normal general appearance Urinary system: urethral meatus normal and bladder without fullness, non tender Vaginal: normal without tenderness, induration or masses Cervix: normal appearance Adnexa: normal bimanual exam Uterus: anteverted and non-tender, normal size    I  have spent a total of  minutes of face-to-face time, excluding clinical staff time, reviewing notes and preparing to see patient, ordering tests and/or medications, and counseling the patient.   Data Reviewed Ultrasound Pathology  Assessment     1. SAB (spontaneous abortion) Rx: - Beta hCG quant (ref lab)  2. Vaginal discharge Rx: - Cervicovaginal ancillary only( [place]) -  metroNIDAZOLE (METROGEL) 1 % gel; Apply topically daily.  Dispense: 45 g; Refill: 2     Plan   Follow up in 1 week  Orders Placed This Encounter  Procedures   Beta hCG quant (ref lab)     Garland Hincapie A. Clearance Coots  11/08/2022

## 2022-11-08 NOTE — Progress Notes (Signed)
Pt presents for SAB on 10/29/22. Pt reports malodorous vaginal discharge.

## 2022-11-09 LAB — BETA HCG QUANT (REF LAB): hCG Quant: 36 m[IU]/mL

## 2022-11-11 LAB — CERVICOVAGINAL ANCILLARY ONLY
Bacterial Vaginitis (gardnerella): POSITIVE — AB
Candida Glabrata: NEGATIVE
Candida Vaginitis: POSITIVE — AB
Chlamydia: NEGATIVE
Comment: NEGATIVE
Comment: NEGATIVE
Comment: NEGATIVE
Comment: NEGATIVE
Comment: NEGATIVE
Comment: NORMAL
Neisseria Gonorrhea: NEGATIVE
Trichomonas: NEGATIVE

## 2022-11-12 MED ORDER — METRONIDAZOLE 0.75 % VA GEL: 1.0000 | Freq: Two times a day (BID) | VAGINAL | 5 refills | Status: DC

## 2022-11-12 NOTE — Addendum Note (Signed)
Addended by: Coral Ceo A on: 11/12/2022 01:24 PM   Modules accepted: Orders

## 2022-11-13 ENCOUNTER — Other Ambulatory Visit: Payer: Self-pay | Admitting: Obstetrics

## 2022-11-14 NOTE — Progress Notes (Signed)
TC. No answer. Left HIPAA compliant VM with call back number and indication MyChart message would be sent. Message including DX, info on RX X 2 and pt eduction sent via MyChart.

## 2022-11-15 ENCOUNTER — Telehealth (INDEPENDENT_AMBULATORY_CARE_PROVIDER_SITE_OTHER): Payer: Medicaid Other | Admitting: Obstetrics

## 2022-11-15 ENCOUNTER — Encounter: Payer: Self-pay | Admitting: Obstetrics

## 2022-11-15 DIAGNOSIS — O039 Complete or unspecified spontaneous abortion without complication: Secondary | ICD-10-CM | POA: Diagnosis not present

## 2022-11-15 NOTE — Progress Notes (Signed)
GYNECOLOGY VIRTUAL VISIT ENCOUNTER NOTE  Provider location: [facility]   Patient location: Home  I connected with Madeline Freeman on 11/15/22 at 11:15 AM EST by MyChart Video Encounter and verified that I am speaking with the correct person using two identifiers.   I discussed the limitations, risks, security and privacy concerns of performing an evaluation and management service virtually and the availability of in person appointments. I also discussed with the patient that there may be a patient responsible charge related to this service. The patient expressed understanding and agreed to proceed.   History:  Madeline Freeman is a 21 y.o. [id number] female being evaluated today for SAB follow up.. She denies any abnormal vaginal discharge, bleeding, pelvic pain or other concerns.       Past Medical History:  Diagnosis Date   Anxiety    Depression    Migraines    Past Surgical History:  Procedure Laterality Date   CHOLECYSTECTOMY N/A 09/19/2021   Procedure: LAPAROSCOPIC CHOLECYSTECTOMY WITH INTRAOPERATIVE CHOLANGIOGRAM;  Surgeon: Quentin Ore, MD;  Location: [facility];  Service: General;  Laterality: N/A;   WISDOM TOOTH EXTRACTION  2018   The following portions of the patient's history were reviewed and updated as appropriate: allergies, current medications, past family history, past medical history, past social history, past surgical history and problem list.   Health Maintenance:  Normal pap and negative HRHPV on 10-29-22.    Review of Systems:  Pertinent items noted in HPI and remainder of comprehensive ROS otherwise negative.  Physical Exam:   General:  Alert, oriented and cooperative. Patient appears to be in no acute distress.  Mental Status: Normal mood and affect. Normal behavior. Normal judgment and thought content.   Respiratory: Normal respiratory effort, no problems with respiration noted  Rest of physical exam deferred due to  type of encounter  Labs and Imaging Results for orders placed or performed in visit on 11/08/22 (from the past 336 hour(s))  Cervicovaginal ancillary only( [place])   Collection Time: 11/08/22 11:35 AM  Result Value Ref Range   Neisseria Gonorrhea Negative    Chlamydia Negative    Trichomonas Negative    Bacterial Vaginitis (gardnerella) Positive (A)    Candida Vaginitis Positive (A)    Candida Glabrata Negative    Comment      Normal Reference Range Bacterial Vaginosis - Negative   Comment Normal Reference Range Candida Species - Negative    Comment Normal Reference Range Candida Galbrata - Negative    Comment Normal Reference Range Trichomonas - Negative    Comment Normal Reference [place] Chlamydia - Negative    Comment      Normal Reference Range Neisseria Gonorrhea - Negative  Beta hCG quant (ref lab)   Collection Time: 11/08/22 11:41 AM  Result Value Ref Range   hCG Quant 36 mIU/mL   US OB LESS THAN 14 WEEKS WITH OB TRANSVAGINAL  Result Date: 10/29/2022 CLINICAL DATA:  Vaginal bleeding. EXAM: OBSTETRIC <14 WK [place] AND TRANSVAGINAL OB US TECHNIQUE: Both transabdominal and transvaginal ultrasound examinations were performed for complete evaluation of the gestation as well as the maternal uterus, adnexal regions, and pelvic cul-de-sac. Transvaginal technique was performed to assess early pregnancy. COMPARISON:  October 21, 2022 FINDINGS: Intrauterine gestational sac: Single, located within the cervical canal Yolk sac:  Visualized and irregular in appearance. Embryo:  Not Visualized. Cardiac Activity: Not Visualized. Heart Rate: N/A  bpm MSD: 12.9 mm   6 w  1 d Subchorionic hemorrhage:  Small Maternal uterus/adnexae: The right ovary measures 2.7 cm x 1.7 cm x 1.7 cm and is normal in appearance. The left ovary measures 3.2 cm x 1.8 cm x 1.9 cm and is normal in appearance. No pelvic free fluid is seen. IMPRESSION: Single gestational sac, containing and irregular appearing yolk sac,  located within the endometrial canal, concerning for a failed pregnancy. Recommend follow-up US in 10-14 days for definitive diagnosis. This recommendation follows SRU consensus guidelines: Diagnostic Criteria for Nonviable Pregnancy Early in the First Trimester. Malva Limes Med 2013; 939:0300-92. Electronically Signed   By: Aram Candela M.D.   On: 10/29/2022 03:37   US OB LESS THAN 14 WEEKS WITH OB TRANSVAGINAL  Result Date: 10/21/2022 CLINICAL DATA:  Pregnant, assessment of viability, follow-up ultrasound, spotting for 2 days, LMP 08/11/2022 EXAM: OBSTETRIC <14 WK [place] AND TRANSVAGINAL OB US TECHNIQUE: Both transabdominal and transvaginal ultrasound examinations were performed for complete evaluation of the gestation as well as the maternal uterus, adnexal regions, and pelvic cul-de-sac. Transvaginal technique was performed to assess early pregnancy. COMPARISON:  10/10/2022 FINDINGS: Intrauterine gestational sac: Present, single, slightly irregular Yolk sac:  Present Embryo:  Not identified Cardiac Activity: N/A Heart Rate: N/A  bpm MSD: 18.2 mm   6 w   5 d Subchorionic hemorrhage:  Small amount of subchorionic hemorrhage Maternal uterus/adnexae: Retroverted uterus without mass. LEFT ovary normal size and morphology 1.9 x 2.0 x 1.4 cm. RIGHT ovary measures 2.9 x 1.9 x 2.8 cm and contains a small corpus luteum. No free pelvic fluid or adnexal masses. IMPRESSION: Single slightly irregular gestational sac containing a yolk sac but no fetal pole identified. The absence of an embryo more than 10 days following an ultrasound exam which showed a gestational sac and a yolk sac is consistent with a definite nonviable pregnancy. Small subchronic hemorrhage. Electronically Signed   By: Ulyses Southward M.D.   On: 10/21/2022 10:34       Assessment and Plan:     1. SAB (spontaneous abortion) Rx: - Beta hCG quant (ref lab); Future       I discussed the assessment and treatment plan with the patient. The patient was  provided an opportunity to ask questions and all were answered. The patient agreed with the plan and demonstrated an understanding of the instructions.   The patient was advised to call back or seek an in-person evaluation/go to the ED if the symptoms worsen or if the condition fails to improve as anticipated.  I have spent a total of 10 minutes of non-face-to-face time, excluding clinical staff time, reviewing notes and preparing to see patient, ordering tests and/or medications, and counseling the patient.    Coral Ceo, MD Center for [facility], [facility], [facility] 11/15/22

## 2022-11-15 NOTE — Progress Notes (Signed)
Patient presents for follow up mychart visit. Patient identified with two patient identifiers. Recent SAB. Last quant was 36 on 12/8. Denies having any bleeding or pain at this time. Patient wants to try to conceive again. No other concerns.

## 2022-12-25 DIAGNOSIS — B9689 Other specified bacterial agents as the cause of diseases classified elsewhere: Secondary | ICD-10-CM | POA: Diagnosis not present

## 2022-12-25 DIAGNOSIS — Z8759 Personal history of other complications of pregnancy, childbirth and the puerperium: Secondary | ICD-10-CM | POA: Diagnosis not present

## 2022-12-25 DIAGNOSIS — N760 Acute vaginitis: Secondary | ICD-10-CM | POA: Diagnosis not present

## 2023-02-07 ENCOUNTER — Ambulatory Visit
Admission: RE | Admit: 2023-02-07 | Discharge: 2023-02-07 | Disposition: A | Discharge status: Home / Self Care | Payer: Medicaid Other | Source: Ambulatory Visit | Attending: Urgent Care | Admitting: Urgent Care

## 2023-02-07 VITALS — BP 127/83 | HR 88 | Temp 98.3°F | Resp 18 | Ht 60.0 in | Wt 130.0 lb

## 2023-02-07 DIAGNOSIS — J3489 Other specified disorders of nose and nasal sinuses: Secondary | ICD-10-CM | POA: Diagnosis not present

## 2023-02-07 DIAGNOSIS — J340 Abscess, furuncle and carbuncle of nose: Secondary | ICD-10-CM

## 2023-02-07 MED ORDER — DOXYCYCLINE HYCLATE 100 MG PO CAPS: 100.0000 mg | ORAL_CAPSULE | Freq: Two times a day (BID) | ORAL | 0 refills | Status: DC

## 2023-02-07 MED ORDER — NAPROXEN 500 MG PO TABS: 500.0000 mg | ORAL_TABLET | Freq: Two times a day (BID) | ORAL | 0 refills | Status: DC

## 2023-02-07 NOTE — ED Triage Notes (Signed)
Patient c/o possible infection on the right side of nose.  The area is red, swollen and painful to the touch.  Patient has been applying warm compresses and antibiotic ointment.

## 2023-02-07 NOTE — ED Provider Notes (Signed)
[place]  Note:  This document was prepared using Dragon voice recognition software and may include unintentional dictation errors.  MRN: [id number] DOB: May 13, 2001  Subjective:   Madeline Freeman is a 22 y.o. female presenting for 2-day history of acute onset persistent and worsening right-sided nasal pain with redness and swelling.  Patient has a piercing to the same nostril but has been there for years.  She did take it out due to the concern for infection.  No fever, drainage of pus or bleeding.  Has been applying warm compresses and using antibiotic ointment without any relief.  No current facility-administered medications for this encounter.  Current Outpatient Medications:    ibuprofen (ADVIL) 800 MG tablet, Take 1 tablet (800 mg total) by mouth every 8 (eight) hours as needed for cramping., Disp: 21 tablet, Rfl: 0   metroNIDAZOLE (METROGEL) 0.75 % vaginal gel, Place 1 Applicatorful vaginally 2 (two) times daily., Disp: 70 g, Rfl: 5   terconazole (TERAZOL 3) 0.8 % vaginal cream, Place 1 applicator vaginally at bedtime., Disp: 20 g, Rfl: 2   Allergies  Allergen Reactions   Rizatriptan Nausea Only    Past Medical History:  Diagnosis Date   Anxiety    Depression    Migraines      Past Surgical History:  Procedure Laterality Date   CHOLECYSTECTOMY N/A 09/19/2021   Procedure: LAPAROSCOPIC CHOLECYSTECTOMY WITH INTRAOPERATIVE CHOLANGIOGRAM;  Surgeon: Felicie Morn, MD;  Location: [place];  Service: General;  Laterality: N/A;   WISDOM TOOTH EXTRACTION  2018    Family History  Problem Relation Age of Onset   Migraines Mother    Endometriosis Mother    Bipolar disorder Mother    Anxiety disorder Mother    Depression Mother    Migraines Father    Anxiety disorder Father    Depression Father    Alcohol abuse Father    Depression Sister    Anxiety disorder Sister    Sexual abuse Sister    Migraines Maternal Grandmother    Depression Maternal  Grandmother    Breast cancer Maternal Grandmother    Ovarian cancer Maternal Grandmother    Migraines Paternal Grandmother     Social History   Tobacco Use   Smoking status: Never    Passive exposure: Never   Smokeless tobacco: Never  Vaping Use   Vaping Use: Every day   Devices: not since confirmed pregnancy  Substance Use Topics   Alcohol use: Not Currently    Comment: occ/ social, not since confirmed pregnancy   Drug use: No    ROS   Objective:   Vitals: BP 127/83 (BP Location: Right Arm)   Pulse 88   Temp 98.3 F (36.8 C) (Oral)   Resp 18   Ht 5' (1.524 m)   Wt 130 lb (59 kg)   LMP 01/30/2023 (Exact Date)   SpO2 100%   BMI 25.39 kg/m   Physical Exam Constitutional:      General: She is not in acute distress.    Appearance: Normal appearance. She is well-developed. She is not ill-appearing, toxic-appearing or diaphoretic.  HENT:     Head: Normocephalic and atraumatic.     Nose: Nose normal.      Mouth/Throat:     Mouth: Mucous membranes are moist.  Eyes:     General: No scleral icterus.       Right eye: No discharge.        Left eye: No discharge.  Extraocular Movements: Extraocular movements intact.  Cardiovascular:     Rate and Rhythm: Normal rate.  Pulmonary:     Effort: Pulmonary effort is normal.  Skin:    General: Skin is warm and dry.  Neurological:     General: No focal deficit present.     Mental Status: She is alert and oriented to person, place, and time.  Psychiatric:        Mood and Affect: Mood normal.        Behavior: Behavior normal.         Assessment and Plan :   PDMP not reviewed this encounter.  1. Cellulitis of sidewall of nose   2. Nasal pain   3. Nasal abscess     Will cover for cellulitis and abscess of the nasal area.  Start doxycycline, continue warm compresses.  Use ibuprofen for pain relief. Counseled patient on potential for adverse effects with medications prescribed/recommended today, ER and  return-to-clinic precautions discussed, patient verbalized understanding.    Jaynee Eagles, [place] 02/07/23 1458

## 2023-02-07 NOTE — Discharge Instructions (Addendum)
Apply warm compresses 3-5 times daily 5-10 minutes at a time as time allows. Start and finish all 10 days worth of doxycycline. Use naproxen for pain and inflammation.

## 2023-02-08 ENCOUNTER — Telehealth: Payer: Self-pay

## 2023-02-08 NOTE — Telephone Encounter (Signed)
TC to f/u after yesterday's visit to [facility]. No answer; left VM to call [phone] for problems or questions. 

## 2023-02-14 DIAGNOSIS — R102 Pelvic and perineal pain: Secondary | ICD-10-CM | POA: Diagnosis not present

## 2023-02-14 DIAGNOSIS — N898 Other specified noninflammatory disorders of vagina: Secondary | ICD-10-CM | POA: Diagnosis not present

## 2023-06-23 DIAGNOSIS — N949 Unspecified condition associated with female genital organs and menstrual cycle: Secondary | ICD-10-CM | POA: Diagnosis not present

## 2023-06-23 DIAGNOSIS — N898 Other specified noninflammatory disorders of vagina: Secondary | ICD-10-CM | POA: Diagnosis not present

## 2023-12-03 NOTE — L&D Delivery Note (Signed)
 Delivery Note At 9:04 PM a viable and healthy female was delivered via Vaginal, Spontaneous (Presentation:   Occiput Anterior).  APGAR: 8, 9; weight  pending.   Placenta status: Spontaneous, Intact.  Cord: 3 vessels with the following complications: None.  Cord pH: NA  Anesthesia: Epidural Episiotomy: None Lacerations: right inner labial Suture Repair: 3.0 vicryl rapide Est. Blood Loss (mL): 207  Mom to postpartum.  Baby to Couplet care / Skin to Skin.  Madeline Freeman 06/25/2024, 9:35 PM

## 2023-12-11 LAB — OB RESULTS CONSOLE GC/CHLAMYDIA
Chlamydia: NEGATIVE
Neisseria Gonorrhea: NEGATIVE

## 2023-12-11 LAB — OB RESULTS CONSOLE HIV ANTIBODY (ROUTINE TESTING): HIV: NONREACTIVE

## 2023-12-11 LAB — OB RESULTS CONSOLE RUBELLA ANTIBODY, IGM: Rubella: NON-IMMUNE/NOT IMMUNE

## 2023-12-11 LAB — OB RESULTS CONSOLE HEPATITIS B SURFACE ANTIGEN: Hepatitis B Surface Ag: NEGATIVE

## 2023-12-11 LAB — OB RESULTS CONSOLE RPR: RPR: NONREACTIVE

## 2023-12-11 LAB — HEPATITIS C ANTIBODY: HCV Ab: NEGATIVE

## 2023-12-11 LAB — OB RESULTS CONSOLE VARICELLA ZOSTER ANTIBODY, IGG: Varicella: IMMUNE

## 2024-05-03 ENCOUNTER — Encounter: Payer: Self-pay | Admitting: Emergency Medicine

## 2024-05-03 ENCOUNTER — Ambulatory Visit
Admission: EM | Admit: 2024-05-03 | Discharge: 2024-05-03 | Disposition: A | Discharge status: Home / Self Care | Attending: Internal Medicine | Admitting: Internal Medicine

## 2024-05-03 DIAGNOSIS — O99513 Diseases of the respiratory system complicating pregnancy, third trimester: Secondary | ICD-10-CM | POA: Diagnosis present

## 2024-05-03 DIAGNOSIS — R5383 Other fatigue: Secondary | ICD-10-CM | POA: Diagnosis not present

## 2024-05-03 DIAGNOSIS — H9203 Otalgia, bilateral: Secondary | ICD-10-CM | POA: Insufficient documentation

## 2024-05-03 DIAGNOSIS — J069 Acute upper respiratory infection, unspecified: Secondary | ICD-10-CM | POA: Diagnosis not present

## 2024-05-03 DIAGNOSIS — Z3A32 32 weeks gestation of pregnancy: Secondary | ICD-10-CM | POA: Diagnosis not present

## 2024-05-03 DIAGNOSIS — R519 Headache, unspecified: Secondary | ICD-10-CM | POA: Diagnosis present

## 2024-05-03 LAB — POC SARS CORONAVIRUS 2 AG -  ED: SARS Coronavirus 2 Ag: NEGATIVE

## 2024-05-03 LAB — POCT RAPID STREP A (OFFICE): Rapid Strep A Screen: NEGATIVE

## 2024-05-03 NOTE — Discharge Instructions (Addendum)
 You have a viral illness which will improve on its own with rest, fluids, and medications to help with your symptoms.  Tylenol , guaifenesin (plain mucinex), and saline nasal sprays may help relieve symptoms.  Refer to list of medications safe in pregnancy.   Two teaspoons of honey in 1 cup of warm water every 4-6 hours may help with throat pains.  Humidifier in room at nighttime may help soothe cough (clean well daily).   For chest pain, shortness of breath, inability to keep food or fluids down without vomiting, fever that does not respond to tylenol  or motrin , or any other severe symptoms, please go to the ER for further evaluation. Return to urgent care as needed, otherwise follow-up with PCP.

## 2024-05-03 NOTE — ED Triage Notes (Signed)
 Patient c/o sore throat, HA, body chills and ear pain x 3 days.  Unable to sleep due to sore throat.  Patient has taken Tylenol  due to pregnancy.  Patient is currently 32 weeks.

## 2024-05-03 NOTE — ED Provider Notes (Addendum)
 [facility]    CSN: [id number] Arrival date & time: 05/03/24  1054      History   Chief Complaint Chief Complaint  Patient presents with   Sore Throat    HPI Madeline Freeman is a 23 y.o. female.   Madeline Freeman is a 23 y.o. female currently [redacted] weeks pregnant (G2P0) presenting for chief complaint of sore throat, bilateral ear pain, cough, congestion, and generalized fatigue that started 3 days ago. Cough is minimally productive, states she feels like there is thick mucous in the chest. Cough is keeping her awake at night. Sore throat is worsened by coughing and swallowing. Denies changes in hearing, dizziness, shortness of breath, chest pain, leg swelling, N/V/D, abdominal pain, and rash. She had chills when symptoms first began a few days ago without known fever at home. No recent sick contacts with similar symptoms. Denies history of chronic respiratory problems. Never smoker. Taking tylenol  with some minimal relief.   Denies vaginal bleeding, abdominal pain, vaginal discharge, and urinary symptoms. Uncomplicated pregnancy, follows with OB/GYN.    Sore Throat    Past Medical History:  Diagnosis Date   Anxiety    Depression    Migraines     Patient Active Problem List   Diagnosis Date Noted   Missed ab 10/21/2022   Migraine without aura and without status migrainosus, not intractable 09/23/2022   Chronic migraine without aura with status migrainosus, not intractable 05/22/2022   Dyspareunia in female 09/20/2019   Scoliosis 11/30/2018    Past Surgical History:  Procedure Laterality Date   CHOLECYSTECTOMY N/A 09/19/2021   Procedure: LAPAROSCOPIC CHOLECYSTECTOMY WITH INTRAOPERATIVE CHOLANGIOGRAM;  Surgeon: Junie Olds, MD;  Location: [facility] OR;  Service: General;  Laterality: N/A;   WISDOM TOOTH EXTRACTION  2018    OB History     Gravida  2   Para  0   Term  0   Preterm  0   AB  1   Living  0      SAB  1   IAB  0   Ectopic  0    Multiple  0   Live Births  0            Home Medications    Prior to Admission medications   Medication Sig Start Date End Date Taking? Authorizing Provider  MAGNESIUM PO Take by mouth.   Yes [provider]  Prenatal Vit-Fe Fumarate-FA (PRENATAL PO) Take by mouth.   Yes [provider]    Family History Family History  Problem Relation Age of Onset   Migraines Mother    Endometriosis Mother    Bipolar disorder Mother    Anxiety disorder Mother    Depression Mother    Migraines Father    Anxiety disorder Father    Depression Father    Alcohol abuse Father    Depression Sister    Anxiety disorder Sister    Sexual abuse Sister    Migraines Maternal Grandmother    Depression Maternal Grandmother    Breast cancer Maternal Grandmother    Ovarian cancer Maternal Grandmother    Migraines Paternal Grandmother     Social History Social History   Tobacco Use   Smoking status: Never    Passive exposure: Never   Smokeless tobacco: Never  Vaping Use   Vaping status: Former   Devices: not since confirmed pregnancy  Substance Use Topics   Alcohol use: Not Currently    Comment: occ/ social,  not since confirmed pregnancy   Drug use: No     Allergies   Rizatriptan    Review of Systems Review of Systems Per HPI  Physical Exam Triage Vital Signs ED Triage Vitals  Encounter Vitals Group     BP 05/03/24 1108 112/71     Systolic BP Percentile --      Diastolic BP Percentile --      Pulse Rate 05/03/24 1108 95     Resp 05/03/24 1108 18     Temp 05/03/24 1108 98.5 F (36.9 C)     Temp Source 05/03/24 1108 Oral     SpO2 05/03/24 1108 96 %     Weight 05/03/24 1110 160 lb (72.6 kg)     Height 05/03/24 1110 5' (1.524 m)     Head Circumference --      Peak Flow --      Pain Score 05/03/24 1110 8     Pain Loc --      Pain Education --      Exclude from Growth Chart --    No data found.  Updated Vital Signs BP 112/71 (BP Location: Right  Arm)   Pulse 95   Temp 98.5 F (36.9 C) (Oral)   Resp 18   Ht 5' (1.524 m)   Wt 160 lb (72.6 kg)   SpO2 96%   BMI 31.25 kg/m   Visual Acuity Right Eye Distance:   Left Eye Distance:   Bilateral Distance:    Right Eye Near:   Left Eye Near:    Bilateral Near:     Physical Exam Vitals and nursing note reviewed.  Constitutional:      Appearance: She is not ill-appearing or toxic-appearing.  HENT:     Head: Normocephalic and atraumatic.     Right Ear: Hearing, tympanic membrane, ear canal and external ear normal.     Left Ear: Hearing, tympanic membrane, ear canal and external ear normal.     Nose: Congestion present.     Mouth/Throat:     Lips: Pink.     Mouth: Mucous membranes are moist. No injury or oral lesions.     Dentition: Normal dentition.     Tongue: No lesions.     Pharynx: Oropharynx is clear. Uvula midline. Posterior oropharyngeal erythema present. No pharyngeal swelling, oropharyngeal exudate, uvula swelling or postnasal drip.     Tonsils: No tonsillar exudate.     Comments: Mild erythema to posterior oropharynx with small amount of clear postnasal drainage visualized. No trismus, phonation normal, maintaining secretions without difficulty.  Eyes:     General: Lids are normal. Vision grossly intact. Gaze aligned appropriately.     Extraocular Movements: Extraocular movements intact.     Conjunctiva/sclera: Conjunctivae normal.  Neck:     Trachea: Trachea and phonation normal.  Cardiovascular:     Rate and Rhythm: Normal rate and regular rhythm.     Heart sounds: Normal heart sounds, S1 normal and S2 normal.  Pulmonary:     Effort: Pulmonary effort is normal. No respiratory distress.     Breath sounds: Normal breath sounds and air entry.  Abdominal:     Palpations: Abdomen is soft.     Comments: Gravid abdomen.  Musculoskeletal:     Cervical back: Neck supple.  Lymphadenopathy:     Cervical: No cervical adenopathy.  Skin:    General: Skin is warm and  dry.     Capillary Refill: Capillary refill takes less than 2 seconds.  Findings: No rash.  Neurological:     General: No focal deficit present.     Mental Status: She is alert and oriented to person, place, and time. Mental status is at baseline.     Cranial Nerves: No dysarthria or facial asymmetry.  Psychiatric:        Mood and Affect: Mood normal.        Speech: Speech normal.        Behavior: Behavior normal.        Thought Content: Thought content normal.        Judgment: Judgment normal.      UC Treatments / Results  Labs (all labs ordered are listed, but only abnormal results are displayed) Labs Reviewed  POCT RAPID STREP A (OFFICE) - Normal  POC SARS CORONAVIRUS 2 AG -  ED    EKG   Radiology No results found.  Procedures Procedures (including critical care time)  Medications Ordered in UC Medications - No data to display  Initial Impression / Assessment and Plan / UC Course  I have reviewed the triage vital signs and the nursing notes.  Pertinent labs & imaging results that were available during my care of the patient were reviewed by me and considered in my medical decision making (see chart for details).   1. Viral URI with cough, diseases of the respiratory system complicating pregnancy in third trimester, [redacted] weeks gestation of pregnancy Suspect viral URI, viral syndrome.  Strep/viral testing: POC COVID and strep negative. Throat culture pending.  List of safe medications in pregnancy for viral URI given with highlighted recommendations (zyrtec , mucinex, etc.)  Physical exam findings reassuring, vital signs hemodynamically stable, and lungs clear, therefore deferred imaging of the chest.  Advised supportive care/prescriptions for symptomatic relief as outlined in AVS.   Counseled patient on potential for adverse effects with medications prescribed/recommended today, strict ER and return-to-clinic precautions discussed, patient verbalized understanding.     Final Clinical Impressions(s) / UC Diagnoses   Final diagnoses:  Viral URI with cough  Diseases of the respiratory system complicating pregnancy, third trimester  [redacted] weeks gestation of pregnancy     Discharge Instructions      You have a viral illness which will improve on its own with rest, fluids, and medications to help with your symptoms.  Tylenol , guaifenesin (plain mucinex), and saline nasal sprays may help relieve symptoms.  Refer to list of medications safe in pregnancy.   Two teaspoons of honey in 1 cup of warm water every 4-6 hours may help with throat pains.  Humidifier in room at nighttime may help soothe cough (clean well daily).   For chest pain, shortness of breath, inability to keep food or fluids down without vomiting, fever that does not respond to tylenol  or motrin , or any other severe symptoms, please go to the ER for further evaluation. Return to urgent care as needed, otherwise follow-up with PCP.    ED Prescriptions   None    PDMP not reviewed this encounter.   Starlene Eaton, FNP 05/03/24 1251    Starlene Eaton, FNP 05/03/24 1252

## 2024-05-05 LAB — CULTURE, GROUP A STREP (THRC)

## 2024-06-02 LAB — OB RESULTS CONSOLE GBS: GBS: POSITIVE

## 2024-06-24 ENCOUNTER — Other Ambulatory Visit: Payer: Self-pay

## 2024-06-24 ENCOUNTER — Inpatient Hospital Stay (HOSPITAL_COMMUNITY)
Admission: AD | Admit: 2024-06-24 | Discharge: 2024-06-27 | DRG: 807 | Disposition: A | Discharge status: Home / Self Care | Attending: Obstetrics & Gynecology | Admitting: Obstetrics & Gynecology

## 2024-06-24 ENCOUNTER — Encounter (HOSPITAL_COMMUNITY): Payer: Self-pay | Admitting: Obstetrics and Gynecology

## 2024-06-24 DIAGNOSIS — Z3A39 39 weeks gestation of pregnancy: Secondary | ICD-10-CM

## 2024-06-24 DIAGNOSIS — B951 Streptococcus, group B, as the cause of diseases classified elsewhere: Secondary | ICD-10-CM | POA: Diagnosis present

## 2024-06-24 DIAGNOSIS — O99824 Streptococcus B carrier state complicating childbirth: Secondary | ICD-10-CM | POA: Diagnosis present

## 2024-06-24 DIAGNOSIS — Z3490 Encounter for supervision of normal pregnancy, unspecified, unspecified trimester: Principal | ICD-10-CM | POA: Diagnosis present

## 2024-06-24 DIAGNOSIS — O4103X0 Oligohydramnios, third trimester, not applicable or unspecified: Principal | ICD-10-CM | POA: Diagnosis present

## 2024-06-24 LAB — TYPE AND SCREEN
ABO/RH(D): A POS
Antibody Screen: NEGATIVE

## 2024-06-24 LAB — CBC
HCT: 36.4 % (ref 36.0–46.0)
Hemoglobin: 12 g/dL (ref 12.0–15.0)
MCH: 27.5 pg (ref 26.0–34.0)
MCHC: 33 g/dL (ref 30.0–36.0)
MCV: 83.5 fL (ref 80.0–100.0)
Platelets: 411 K/uL — ABNORMAL HIGH (ref 150–400)
RBC: 4.36 MIL/uL (ref 3.87–5.11)
RDW: 14.7 % (ref 11.5–15.5)
WBC: 13.5 K/uL — ABNORMAL HIGH (ref 4.0–10.5)
nRBC: 0 % (ref 0.0–0.2)

## 2024-06-24 NOTE — MAU Provider Note (Signed)
 Chief Complaint:  No chief complaint on file.   None    HPI  HPI: Madeline Freeman is a 23 y.o. G2P0010 at 62w4dwho presents to maternity admissions reporting good fetal movement. Direct admission from out-patient at [facility] for low AFI on BPPx2 on 7/23 and 7/24.  Denies leaking of fluid, vaginal bleeding, or contractions.  Past Medical History: Past Medical History:  Diagnosis Date   Anxiety    Depression    Migraines     Past obstetric history: OB History  Gravida Para Term Preterm AB Living  2 0 0 0 1 0  SAB IAB Ectopic Multiple Live Births  1 0 0 0 0    # Outcome Date GA Lbr Len/2nd Weight Sex Type Anes PTL Lv  2 Current           1 SAB 10/2022            Past Surgical History: Past Surgical History:  Procedure Laterality Date   CHOLECYSTECTOMY N/A 09/19/2021   Procedure: LAPAROSCOPIC CHOLECYSTECTOMY WITH INTRAOPERATIVE CHOLANGIOGRAM;  Surgeon: Lyndel Deward PARAS, MD;  Location: [facility] OR;  Service: General;  Laterality: N/A;   WISDOM TOOTH EXTRACTION  2018    Family History: Family History  Problem Relation Age of Onset   Migraines Mother    Endometriosis Mother    Bipolar disorder Mother    Anxiety disorder Mother    Depression Mother    Migraines Father    Anxiety disorder Father    Depression Father    Alcohol abuse Father    Depression Sister    Anxiety disorder Sister    Sexual abuse Sister    Migraines Maternal Grandmother    Depression Maternal Grandmother    Breast cancer Maternal Grandmother    Ovarian cancer Maternal Grandmother    Migraines Paternal Grandmother     Social History: Social History   Tobacco Use   Smoking status: Never    Passive exposure: Never   Smokeless tobacco: Never  Vaping Use   Vaping status: Former   Devices: not since confirmed pregnancy  Substance Use Topics   Alcohol use: Not Currently    Comment: occ/ social, not since confirmed pregnancy   Drug use: No    Allergies:  Allergies  Allergen  Reactions   Rizatriptan  Nausea Only    Meds:  Medications Prior to Admission  Medication Sig Dispense Refill Last Dose/Taking   MAGNESIUM PO Take by mouth.   06/23/2024   Prenatal Vit-Fe Fumarate-FA (PRENATAL PO) Take by mouth.   06/23/2024    I have reviewed patient's Past Medical Hx, Surgical Hx, Family Hx, Social Hx, medications and allergies.   ROS:  Review of Systems Other systems negative  Physical Exam  Patient Vitals for the past 24 hrs:  BP Temp Temp src Pulse Resp SpO2 Height Weight  06/24/24 1645 138/78 -- -- (!) 106 -- -- -- --  06/24/24 1631 130/76 98.6 F (37 C) Oral 99 18 100 % -- --  06/24/24 1627 -- -- -- -- -- -- 5' (1.524 m) 80.3 kg   Constitutional: Well-developed, well-nourished female in no acute distress.  Cardiovascular: normal rate and rhythm Respiratory: normal effort, clear to auscultation bilaterally GI: Abd soft, non-tender, gravid appropriate for gestational age.   No rebound or guarding. MS: Extremities nontender, no edema, normal ROM Neurologic: Alert and oriented x 4.  GU: Neg CVAT   FHT: Baseline 140 , moderate variability, accelerations absent, no decelerations Contractions: showing irritability at this  time.    Labs: No results found for this or any previous visit (from the past 24 hours).    Imaging:  No results found.  MAU Course/MDM: NST reviewed Consult with Dr. Linnell to discuss exam findings and test results.  Treatments in MAU included NST, routine lab work.    Assessment: - Non-reassuring fetal testing: Primigravida G2P0 presenting for IOL d/t low AFI seen on antenatal testing for maternal report of DFM. She now endorses good FM, and consents to IOL up admission.     Plan: - cEFM in place. Cat 1 tracing at this time. Uterine irritability, resting tone soft. - Continue antenatal monitoring in MAU and admit to L&D as available for IOL d/t fetal indications.  - Dr. Linnell aware of pt admission and status at this time.  Discharge home - Discussed labor precautions and fetal kick counts during this time.    Madeline Freeman 06/24/2024, 5:18 PM

## 2024-06-24 NOTE — MAU Provider Note (Signed)
 S Ms. Madeline Freeman is a 23 y.o. G2P0010 patient who presents to MAU for observation. She was found to have oligohydramnios and is to be admitted to L&D when they have a bed available. Normal FM, no LOF, VB, ctx.  O BP 130/76 (BP Location: Right Arm)   Pulse 99   Temp 98.6 F (37 C) (Oral)   Resp 18   Ht 5' (1.524 m)   Wt 80.3 kg   SpO2 100%   BMI 34.59 kg/m  Physical Exam Constitutional:      Appearance: Normal appearance.  Cardiovascular:     Rate and Rhythm: Normal rate.  Abdominal:     Comments: gravid  Neurological:     General: No focal deficit present.     Mental Status: She is alert.   EFM: 135/mod/-a/-d  A Medical screening exam complete  P To L&D when bed available  Von Reasoner, MD 06/24/2024 5:01 PM

## 2024-06-24 NOTE — Progress Notes (Addendum)
  HPI: Madeline Freeman is a 23 y.o. G2P0010 at 36w4dwho presents to maternity admissions reporting good fetal movement. Direct admission from out-patient at [facility] for low AFI on BPPx2 on 7/23 and 7/24, her pregnancy remains otherwise uncomplicated. Denies leaking of fluid, vaginal bleeding, or contractions.  Expecting baby girl.   Fetal status: NST and fetal status reactive and reassuring since admission.   Vital signs: Temp:  [98.6 F (37 C)] 98.6 F (37 C) (07/24 1631) Pulse Rate:  [99-106] 106 (07/24 1645) Resp:  [18] 18 (07/24 1631) BP: (130-138)/(76-78) 138/78 (07/24 1645) SpO2:  [100 %] 100 % (07/24 1631) Weight:  [80.3 kg] 80.3 kg (07/24 1627)   CBC    Component Value Date/Time   WBC 13.5 (H) 06/24/2024 1804   RBC 4.36 06/24/2024 1804   HGB 12.0 06/24/2024 1804   HGB 14.9 05/22/2022 0842   HCT 36.4 06/24/2024 1804   HCT 44.1 05/22/2022 0842   PLT 411 (H) 06/24/2024 1804   PLT 392 05/22/2022 0842   MCV 83.5 06/24/2024 1804   MCV 91 05/22/2022 0842   MCH 27.5 06/24/2024 1804   MCHC 33.0 06/24/2024 1804   RDW 14.7 06/24/2024 1804   RDW 12.4 05/22/2022 0842   LYMPHSABS 3.4 10/29/2022 0255   LYMPHSABS 2.0 05/22/2022 0842   MONOABS 0.7 10/29/2022 0255   EOSABS 0.1 10/29/2022 0255   EOSABS 0.1 05/22/2022 0842   BASOSABS 0.1 10/29/2022 0255   BASOSABS 0.1 05/22/2022 0842    A/P: Madeline Freeman was triaged and admitted to in-patient care at 1700 today for IOL d/t low AFI, she has has hourly reactive and reassuring NST's since admission. She is awaiting admission to L&D, but has not been transferred d/t higher acuity patients taking precedent for transfer to L&D. After deliberation with care team, Madeline Freeman and her husband will either be transferred to [facility] care or remain in MAU for observation overnight until she can move directly up to L&D to proceed with her IOL.  - Maternal VSS.  - Fetal status stable, NSTs reactive and reassuring. Maternal endorsement of fetal  movement. Precautions given, knows when to call bedside RN is DFM.  -In MAU or upon admission to OB-Specialty care fetal status will be monitored with an NST at least q 4-hours, or on placed cEFM if indicated from a non-reassuring strip.   -Dr. Linnell and Alan Molt, CNM aware of pt status, transfer and POC.  To reassess transfer, POC and fetal status as indicated.

## 2024-06-24 NOTE — MAU Note (Signed)
 Madeline Freeman is a 23 y.o. at [redacted]w[redacted]d here in MAU reporting: sent from office to be induced secondary low fluid.  Denies VB or LOF.  Endorses +FM.  LMP: NA Onset of complaint: today Pain score: 0 Vitals:   06/24/24 1631  BP: 130/76  Pulse: 99  Resp: 18  Temp: 98.6 F (37 C)  SpO2: 100%     FHT: 140 bpm  Lab orders placed from triage: None

## 2024-06-25 ENCOUNTER — Encounter (HOSPITAL_COMMUNITY): Payer: Self-pay

## 2024-06-25 ENCOUNTER — Inpatient Hospital Stay (HOSPITAL_COMMUNITY): Admitting: Anesthesiology

## 2024-06-25 DIAGNOSIS — O4103X0 Oligohydramnios, third trimester, not applicable or unspecified: Secondary | ICD-10-CM | POA: Diagnosis present

## 2024-06-25 DIAGNOSIS — Z3A39 39 weeks gestation of pregnancy: Secondary | ICD-10-CM | POA: Diagnosis not present

## 2024-06-25 DIAGNOSIS — O99824 Streptococcus B carrier state complicating childbirth: Secondary | ICD-10-CM | POA: Diagnosis present

## 2024-06-25 DIAGNOSIS — Z3490 Encounter for supervision of normal pregnancy, unspecified, unspecified trimester: Principal | ICD-10-CM | POA: Diagnosis present

## 2024-06-25 LAB — RPR: RPR Ser Ql: NONREACTIVE

## 2024-06-25 MED ORDER — MISOPROSTOL 50MCG HALF TABLET
50.0000 ug | ORAL_TABLET | Freq: Once | ORAL | Status: AC
  Administered 2024-06-25: 50 ug via ORAL
  Filled 2024-06-25: qty 1

## 2024-06-25 MED ORDER — EPHEDRINE 5 MG/ML INJ: 10.0000 mg | INTRAVENOUS | Status: DC | PRN

## 2024-06-25 MED ORDER — MISOPROSTOL 50MCG HALF TABLET
50.0000 ug | ORAL_TABLET | ORAL | Status: DC | PRN
  Administered 2024-06-25: 50 ug via BUCCAL
  Filled 2024-06-25: qty 1

## 2024-06-25 MED ORDER — ONDANSETRON HCL 4 MG/2ML IJ SOLN
4.0000 mg | Freq: Four times a day (QID) | INTRAMUSCULAR | Status: DC | PRN
Start: 2024-06-25 — End: 2024-06-25

## 2024-06-25 MED ORDER — OXYTOCIN BOLUS FROM INFUSION
333.0000 mL | Freq: Once | INTRAVENOUS | Status: AC
  Administered 2024-06-25: 333 mL via INTRAVENOUS

## 2024-06-25 MED ORDER — LACTATED RINGERS IV SOLN: INTRAVENOUS | Status: DC

## 2024-06-25 MED ORDER — FLEET ENEMA RE ENEM
1.0000 | ENEMA | RECTAL | Status: DC | PRN
Start: 2024-06-25 — End: 2024-06-25

## 2024-06-25 MED ORDER — OXYCODONE-ACETAMINOPHEN 5-325 MG PO TABS: 1.0000 | ORAL_TABLET | ORAL | Status: DC | PRN

## 2024-06-25 MED ORDER — SOD CITRATE-CITRIC ACID 500-334 MG/5ML PO SOLN: 30.0000 mL | ORAL | Status: DC | PRN

## 2024-06-25 MED ORDER — PHENYLEPHRINE 80 MCG/ML (10ML) SYRINGE FOR IV PUSH (FOR BLOOD PRESSURE SUPPORT): 80.0000 ug | PREFILLED_SYRINGE | INTRAVENOUS | Status: DC | PRN

## 2024-06-25 MED ORDER — LACTATED RINGERS IV SOLN
500.0000 mL | Freq: Once | INTRAVENOUS | Status: AC
  Administered 2024-06-25: 500 mL via INTRAVENOUS

## 2024-06-25 MED ORDER — FENTANYL-BUPIVACAINE-NACL 0.5-0.125-0.9 MG/250ML-% EP SOLN
12.0000 mL/h | EPIDURAL | Status: DC | PRN
  Administered 2024-06-25: 12 mL/h via EPIDURAL
  Filled 2024-06-25: qty 250

## 2024-06-25 MED ORDER — OXYTOCIN-SODIUM CHLORIDE 30-0.9 UT/500ML-% IV SOLN
2.5000 [IU]/h | INTRAVENOUS | Status: DC
  Administered 2024-06-25: 2.5 [IU]/h via INTRAVENOUS
  Filled 2024-06-25: qty 500

## 2024-06-25 MED ORDER — OXYTOCIN-SODIUM CHLORIDE 30-0.9 UT/500ML-% IV SOLN
1.0000 m[IU]/min | INTRAVENOUS | Status: DC
  Administered 2024-06-25: 2 m[IU]/min via INTRAVENOUS

## 2024-06-25 MED ORDER — LACTATED RINGERS IV SOLN
500.0000 mL | INTRAVENOUS | Status: DC | PRN
  Administered 2024-06-25: 1000 mL via INTRAVENOUS

## 2024-06-25 MED ORDER — PENICILLIN G POT IN DEXTROSE 60000 UNIT/ML IV SOLN
3.0000 10*6.[IU] | INTRAVENOUS | Status: DC
  Administered 2024-06-25 (×2): 3 10*6.[IU] via INTRAVENOUS
  Filled 2024-06-25 (×2): qty 50

## 2024-06-25 MED ORDER — DIPHENHYDRAMINE HCL 50 MG/ML IJ SOLN: 12.5000 mg | INTRAMUSCULAR | Status: DC | PRN

## 2024-06-25 MED ORDER — TERBUTALINE SULFATE 1 MG/ML IJ SOLN: 0.2500 mg | Freq: Once | INTRAMUSCULAR | Status: DC | PRN

## 2024-06-25 MED ORDER — OXYCODONE-ACETAMINOPHEN 5-325 MG PO TABS: 2.0000 | ORAL_TABLET | ORAL | Status: DC | PRN

## 2024-06-25 MED ORDER — LIDOCAINE HCL (PF) 1 % IJ SOLN: 30.0000 mL | INTRAMUSCULAR | Status: DC | PRN

## 2024-06-25 MED ORDER — SODIUM CHLORIDE 0.9 % IV SOLN
5.0000 10*6.[IU] | Freq: Once | INTRAVENOUS | Status: AC
  Administered 2024-06-25: 5 10*6.[IU] via INTRAVENOUS
  Filled 2024-06-25: qty 5

## 2024-06-25 MED ORDER — LIDOCAINE HCL (PF) 1 % IJ SOLN
INTRAMUSCULAR | Status: DC | PRN
  Administered 2024-06-25: 10 mL via EPIDURAL

## 2024-06-25 MED ORDER — ACETAMINOPHEN 325 MG PO TABS: 650.0000 mg | ORAL_TABLET | ORAL | Status: DC | PRN

## 2024-06-25 NOTE — Anesthesia Preprocedure Evaluation (Signed)
 Anesthesia Evaluation  Patient identified by MRN, date of birth, ID band Patient awake    Reviewed: Allergy & Precautions, NPO status , Patient's Chart, lab work & pertinent test results  Airway Mallampati: II  TM Distance: >3 FB Neck ROM: Full    Dental no notable dental hx.    Pulmonary neg pulmonary ROS   Pulmonary exam normal breath sounds clear to auscultation       Cardiovascular negative cardio ROS Normal cardiovascular exam Rhythm:Regular Rate:Normal     Neuro/Psych  Headaches PSYCHIATRIC DISORDERS Anxiety Depression       GI/Hepatic negative GI ROS, Neg liver ROS,,,  Endo/Other  negative endocrine ROS    Renal/GU negative Renal ROS  negative genitourinary   Musculoskeletal negative musculoskeletal ROS (+)    Abdominal   Peds  Hematology negative hematology ROS (+)   Anesthesia Other Findings IOL for oligo  Reproductive/Obstetrics                              Anesthesia Physical Anesthesia Plan  ASA: 2  Anesthesia Plan: Epidural   Post-op Pain Management:    Induction:   PONV Risk Score and Plan: Treatment may vary due to age or medical condition  Airway Management Planned: Natural Airway  Additional Equipment:   Intra-op Plan:   Post-operative Plan:   Informed Consent: I have reviewed the patients History and Physical, chart, labs and discussed the procedure including the risks, benefits and alternatives for the proposed anesthesia with the patient or authorized representative who has indicated his/her understanding and acceptance.       Plan Discussed with: Anesthesiologist  Anesthesia Plan Comments: (Patient identified. Risks, benefits, options discussed with patient including but not limited to bleeding, infection, nerve damage, paralysis, failed block, incomplete pain control, headache, blood pressure changes, nausea, vomiting, reactions to medication,  itching, and post partum back pain. Confirmed with bedside nurse the patient's most recent platelet count. Confirmed with the patient that they are not taking any anticoagulation, have any bleeding history or any family history of bleeding disorders. Patient expressed understanding and wishes to proceed. All questions were answered. )        Anesthesia Quick Evaluation

## 2024-06-25 NOTE — Progress Notes (Signed)
 S: Madeline Freeman presents sitting on birth ball and ambulating to bedside for cervical exam. She reports pain with ctx increasing to 5/10. R/B/A to AROM at this time reviewed. Pt consent to SVE and AROM.  O: Vitals:   06/25/24 0437 06/25/24 0807 06/25/24 1034 06/25/24 1200  BP: (!) 90/42 122/68 125/67 (!) 140/76  Pulse: 72 84 93 97  Resp:  14 16   Temp:  98.6 F (37 C) 98.8 F (37.1 C)   TempSrc:  Oral Oral   SpO2:      Weight:      Height:        FHT:  FHR: 140 bpm, variability: moderate,  accelerations:  Present,  decelerations:  Absent UC:   irregular, every 2-4 minutes, resting tone soft.  SVE:   Dilation: 3 Effacement (%): 70 Station: -2 Exam by:: Aisea Bouldin CNM  A / P: Induction of labor due to low AFI,  progressing well on pitocin. - AROM clear fluid, no odor, slightly yellow in coloring. To continue to monitor leaking in pad.  - VSS - GBS positive, PCN infusing.  - FHR Cat 1. - Uc's via toco regular in pattern and consistency q 2-4 minutes, strong to maternal report. Resting tone soft.  - Pain 6/10 at this time. Maternal coping beginning to be exhausted. Knows she can get epidural as desired, continuing with position changes at this time.  - POC to continue IOL, pitocin if/when indicated. PCN as indicated. Anticipate NSVD in LDR.   Fetal Wellbeing:  Category I GBS: Positive, PCN in labor.  Pain Control:  Labor support without medications Anticipated MOD:  NSVD  Zalika Tieszen G Sarabelle Genson, CNM, MSN 06/25/2024, 12:19 PM

## 2024-06-25 NOTE — Anesthesia Procedure Notes (Signed)
 Epidural Patient location during procedure: OB Start time: 06/25/2024 1:25 PM End time: 06/25/2024 1:35 PM  Staffing Anesthesiologist: Niels Marien CROME, MD Performed: anesthesiologist   Preanesthetic Checklist Completed: patient identified, IV checked, risks and benefits discussed, monitors and equipment checked, pre-op evaluation and timeout performed  Epidural Patient position: sitting Prep: DuraPrep and site prepped and draped Patient monitoring: continuous pulse ox, blood pressure, heart rate and cardiac monitor Approach: midline Location: L3-L4 Injection technique: LOR air  Needle:  Needle type: Tuohy  Needle gauge: 17 G Needle length: 9 cm Needle insertion depth: 5 cm Catheter type: closed end flexible Catheter size: 19 Gauge Catheter at skin depth: 10 cm Test dose: negative  Assessment Sensory level: T8 Events: blood not aspirated, no cerebrospinal fluid, injection not painful, no injection resistance, no paresthesia and negative IV test  Additional Notes Patient identified. Risks/Benefits/Options discussed with patient including but not limited to bleeding, infection, nerve damage, paralysis, failed block, incomplete pain control, headache, blood pressure changes, nausea, vomiting, reactions to medication both or allergic, itching and postpartum back pain. Confirmed with bedside nurse the patient's most recent platelet count. Confirmed with patient that they are not currently taking any anticoagulation, have any bleeding history or any family history of bleeding disorders. Patient expressed understanding and wished to proceed. All questions were answered. Sterile technique was used throughout the entire procedure. Please see nursing notes for vital signs. Test dose was given through epidural catheter and negative prior to continuing to dose epidural or start infusion. Warning signs of high block given to the patient including shortness of breath, tingling/numbness in hands,  complete motor block, or any concerning symptoms with instructions to call for help. Patient was given instructions on fall risk and not to get out of bed. All questions and concerns addressed with instructions to call with any issues or inadequate analgesia.  Reason for block:procedure for pain

## 2024-06-25 NOTE — H&P (Addendum)
 OB ADMISSION/ HISTORY & PHYSICAL:  Admission Date: 06/24/2024  4:10 PM  Admit Diagnosis: Encounter for induction of labor [Z34.90]    Madeline Freeman is a 23 y.o. female G2P0010 at [redacted]w[redacted]d presenting for IOL d/t low AFI on BPPx2 on 7/23 and 7/24. She denies VB, LOF or cramping upon admission and endorses regular fetal movement over the last 24-hours. Her pregnancy course has been complicated by GBS positive status, maternal migraines w/o aura, and low AFI at term. She is supported by her husband at bedside and they are eagerly anticipating baby girl Madeline Freeman.  Prenatal History: G2P0010   EDC: 06/27/2024, Date entered prior to episode creation  Prenatal care at [facility] since 1st trimester.   Primary: Dr. Barbette.   Prenatal course complicated by: Low AFI at term, GBS positive status, maternal migraines.   Prenatal Labs: ABO, Rh:    Antibody: NEG (07/24 1801) Rubella: Nonimmune (01/09 0000)  RPR: NON REACTIVE (07/24 1804)  HBsAg: Negative (01/09 0000)  HIV: Non-reactive (01/09 0000)  GBS: Positive/-- (07/02 0000)  1 hr Glucola : 132mg /dL. Genetic Screening: NIPT low risk. Ultrasound: 1st trimester, CUS and growth 45%tile, 6lb5oz at 36wga.     Maternal Diabetes: No Genetic Screening: Normal Maternal Ultrasounds/Referrals: WNL until low AFI in 3T.  Fetal Ultrasounds or other Referrals:  None Maternal Substance Abuse:  No Significant Maternal Medications:  None Significant Maternal Lab Results:  Group B Strep positive Other Comments:  None  Medical / Surgical History : Past medical history:  Past Medical History:  Diagnosis Date   Anxiety    Depression    Migraines     Past surgical history:  Past Surgical History:  Procedure Laterality Date   CHOLECYSTECTOMY N/A 09/19/2021   Procedure: LAPAROSCOPIC CHOLECYSTECTOMY WITH INTRAOPERATIVE CHOLANGIOGRAM;  Surgeon: Lyndel Deward PARAS, MD;  Location: [facility] OR;  Service: General;  Laterality: N/A;   WISDOM TOOTH  EXTRACTION  2018    Family History:  Family History  Problem Relation Age of Onset   Migraines Mother    Endometriosis Mother    Bipolar disorder Mother    Anxiety disorder Mother    Depression Mother    Migraines Father    Anxiety disorder Father    Depression Father    Alcohol abuse Father    Depression Sister    Anxiety disorder Sister    Sexual abuse Sister    Migraines Maternal Grandmother    Depression Maternal Grandmother    Breast cancer Maternal Grandmother    Ovarian cancer Maternal Grandmother    Migraines Paternal Grandmother     Social History:  reports that she has never smoked. She has never been exposed to tobacco smoke. She has never used smokeless tobacco. She reports that she does not currently use alcohol. She reports that she does not use drugs.  Allergies: Rizatriptan    Current Medications at time of admission:  Medications Prior to Admission  Medication Sig Dispense Refill Last Dose/Taking   MAGNESIUM PO Take by mouth.   06/23/2024   Prenatal Vit-Fe Fumarate-FA (PRENATAL PO) Take by mouth.   06/23/2024     Review of Systems: ROS  Physical Exam: Vital signs and nursing notes reviewed.  Patient Vitals for the past 24 hrs:  BP Temp Temp src Pulse Resp SpO2 Height Weight  06/25/24 1034 125/67 98.8 F (37.1 C) Oral 93 16 -- -- --  06/25/24 0807 122/68 98.6 F (37 C) Oral 84 14 -- -- --  06/25/24 0437 (!) 90/42 -- --  72 -- -- -- --  06/25/24 0204 133/77 98.5 F (36.9 C) Oral 99 -- -- -- --  06/24/24 1645 138/78 -- -- (!) 106 -- -- -- --  06/24/24 1631 130/76 98.6 F (37 C) Oral 99 18 100 % -- --  06/24/24 1627 -- -- -- -- -- -- 5' (1.524 m) 80.3 kg     General: AAO x 3, NAD Heart: RRR Lungs:CTAB Abdomen: Gravid, NT Extremities: no edema SVE: Dilation: 2 Effacement (%): 60, 70 Cervical Position: Middle Station: -2 Presentation: Vertex Exam by:: CSABRA Eagles, RNC   FHR: 140BPM, moderate variability, 15x15 accels, no decels TOCO:  Contractions 2-4 minutes, mild to intensity per maternal report. Resting tone soft.   Labs:   Recent Labs    06/24/24 1804  WBC 13.5*  HGB 12.0  HCT 36.4  PLT 411*    Assessment/Plan: 23 y.o. G2P0010 at [redacted]w[redacted]d here for IOL d/t low AFI. She reports good fetal movement overnight, denies VB or LOF at this time.  - VSS. - GBS positive: IV PCN per unit protocol, 1st dose given. Temp 98.6. - FHR Cat 1 tracing at this time. cEFM in place.  - Uc's q 2-4 minutes, mild to maternal report. Resting tone soft.  - SVE 2/60/-2 per bedside RN at 0800, to repeat SVE upon reassessment at 1200 or as indicated.  - IOL progressing well after 2nd dose of buccal Cytotec given aprox 0800. Plan to reassess at 1200 for possible 3rd dose of Cytotec or pitocin/AROM if/when indicated. Reviewed R/B/A to foley balloon placement upon admission, in MAU and on L&D. Pt declines foley balloon for IOL, consents to medical IOL.  - Pain 2/10 with ctx at this time, coping well with movement and position changes. Hydrotherapy in shower or SLE as desired. R/o of water birth for fetal indication, pt verbalizes understanding.  - Dr. Linnell aware of pt status and POC.   Fetal wellbeing - FHT category 1 EFW  Labor: Latent.   GBS positive. PCN in labor.  Rubella Immune. Rh Positive.   Pain control: desires unmediated delivery, SLE as desired.  Analgesia/anesthesia PRN  Anticipated MOD: NSVB.   Plans to breastfeed, n/a circumcision. POC discussed with patient and support team, all questions answered.  Dr Linnell notified of admission/plan of care.   Sherrilee KANDICE Both CNM, MSN 06/25/2024, 10:37 AM

## 2024-06-26 DIAGNOSIS — B951 Streptococcus, group B, as the cause of diseases classified elsewhere: Secondary | ICD-10-CM | POA: Diagnosis present

## 2024-06-26 LAB — CBC
HCT: 34.9 % — ABNORMAL LOW (ref 36.0–46.0)
Hemoglobin: 11.6 g/dL — ABNORMAL LOW (ref 12.0–15.0)
MCH: 27.7 pg (ref 26.0–34.0)
MCHC: 33.2 g/dL (ref 30.0–36.0)
MCV: 83.3 fL (ref 80.0–100.0)
Platelets: 422 K/uL — ABNORMAL HIGH (ref 150–400)
RBC: 4.19 MIL/uL (ref 3.87–5.11)
RDW: 14.8 % (ref 11.5–15.5)
WBC: 19.5 K/uL — ABNORMAL HIGH (ref 4.0–10.5)
nRBC: 0 % (ref 0.0–0.2)

## 2024-06-26 MED ORDER — IBUPROFEN 600 MG PO TABS
600.0000 mg | ORAL_TABLET | Freq: Four times a day (QID) | ORAL | Status: DC
  Administered 2024-06-26 – 2024-06-27 (×5): 600 mg via ORAL
  Filled 2024-06-26 (×7): qty 1

## 2024-06-26 MED ORDER — DIPHENHYDRAMINE HCL 25 MG PO CAPS: 25.0000 mg | ORAL_CAPSULE | Freq: Four times a day (QID) | ORAL | Status: DC | PRN

## 2024-06-26 MED ORDER — DIBUCAINE (PERIANAL) 1 % EX OINT: 1.0000 | TOPICAL_OINTMENT | CUTANEOUS | Status: DC | PRN

## 2024-06-26 MED ORDER — SENNOSIDES-DOCUSATE SODIUM 8.6-50 MG PO TABS
2.0000 | ORAL_TABLET | Freq: Every day | ORAL | Status: DC
  Administered 2024-06-26 – 2024-06-27 (×2): 2 via ORAL
  Filled 2024-06-26 (×2): qty 2

## 2024-06-26 MED ORDER — BENZOCAINE-MENTHOL 20-0.5 % EX AERO: 1.0000 | INHALATION_SPRAY | CUTANEOUS | Status: DC | PRN

## 2024-06-26 MED ORDER — PRENATAL MULTIVITAMIN CH
1.0000 | ORAL_TABLET | Freq: Every day | ORAL | Status: DC
  Administered 2024-06-26 – 2024-06-27 (×2): 1 via ORAL
  Filled 2024-06-26 (×2): qty 1

## 2024-06-26 MED ORDER — SIMETHICONE 80 MG PO CHEW: 80.0000 mg | CHEWABLE_TABLET | ORAL | Status: DC | PRN

## 2024-06-26 MED ORDER — TETANUS-DIPHTH-ACELL PERTUSSIS 5-2.5-18.5 LF-MCG/0.5 IM SUSY: 0.5000 mL | PREFILLED_SYRINGE | Freq: Once | INTRAMUSCULAR | Status: DC

## 2024-06-26 MED ORDER — ZOLPIDEM TARTRATE 5 MG PO TABS: 5.0000 mg | ORAL_TABLET | Freq: Every evening | ORAL | Status: DC | PRN

## 2024-06-26 MED ORDER — ACETAMINOPHEN 325 MG PO TABS: 650.0000 mg | ORAL_TABLET | ORAL | Status: DC | PRN

## 2024-06-26 MED ORDER — ONDANSETRON HCL 4 MG PO TABS: 4.0000 mg | ORAL_TABLET | ORAL | Status: DC | PRN

## 2024-06-26 MED ORDER — COCONUT OIL OIL: 1.0000 | TOPICAL_OIL | Status: DC | PRN

## 2024-06-26 MED ORDER — WITCH HAZEL-GLYCERIN EX PADS: 1.0000 | MEDICATED_PAD | CUTANEOUS | Status: DC | PRN

## 2024-06-26 MED ORDER — ONDANSETRON HCL 4 MG/2ML IJ SOLN: 4.0000 mg | INTRAMUSCULAR | Status: DC | PRN

## 2024-06-26 NOTE — Anesthesia Postprocedure Evaluation (Signed)
 Anesthesia Post Note  Patient: Madeline Freeman  Procedure(s) Performed: AN AD HOC LABOR EPIDURAL     Patient location during evaluation: Mother Baby Anesthesia Type: Epidural Level of consciousness: awake and alert Pain management: pain level controlled Vital Signs Assessment: post-procedure vital signs reviewed and stable Respiratory status: spontaneous breathing, nonlabored ventilation and respiratory function stable Cardiovascular status: stable Postop Assessment: no headache, no backache and epidural receding Anesthetic complications: no   No notable events documented.  Last Vitals:  Vitals:   06/26/24 0103 06/26/24 0511  BP: 130/75 132/82  Pulse: 72 93  Resp: 18 18  Temp: 37 C 37 C  SpO2: 99% 98%    Last Pain:  Vitals:   06/26/24 0511  TempSrc: Oral  PainSc: 0-No pain   Pain Goal:                   Anorah Trias

## 2024-06-26 NOTE — Lactation Note (Signed)
 This note was copied from a baby's chart. Lactation Consultation Note  Patient Name: Madeline Freeman Unijb'd Date: 06/26/2024 Age:24 hours Reason for consult: Initial assessment;1st time breastfeeding;Term.  P1, term female infant, MOB feels infant is breastfeeding well. MOB latched infant on her left breast using the cradle hold position, infant latched with depth and was still breastfeeding after 15 minutes when LC left the room. MOB knows if infant is still cuing after latching on the first breast to offer the 2nd breast during the same feeding. LC discussed infant hunger cues and signs of satiety. MOB will continue to breastfeed infant by cues, on demand, 8-12 times within 24 hours, skin to skin. LC discussed the importance of maternal rest, meals and hydration. MOB was  made aware of O/P services, breastfeeding support groups, community resources, and our phone # for post-discharge questions.    Maternal Data Has patient been taught Hand Expression?: Yes Does the patient have breastfeeding experience prior to this delivery?: No  Feeding Mother's Current Feeding Choice: Breast Milk  LATCH Score Latch: Grasps breast easily, tongue down, lips flanged, rhythmical sucking.  Audible Swallowing: Spontaneous and intermittent  Type of Nipple: Everted at rest and after stimulation  Comfort (Breast/Nipple): Soft / non-tender  Hold (Positioning): Assistance needed to correctly position infant at breast and maintain latch.  LATCH Score: 9   Lactation Tools Discussed/Used    Interventions Interventions: Assisted with latch;Breast feeding basics reviewed;Skin to skin;Breast compression;Adjust position;Support pillows;Position options;Expressed milk;Education;Guidelines for Milk Supply and Pumping Schedule Handout;CDC milk storage guidelines;CDC Guidelines for Breast Pump Cleaning;LC Services brochure  Discharge Pump: DEBP;Personal  Consult Status Consult Status: Follow-up Date:  06/26/24 Follow-up type: In-patient    Grayce LULLA Batter 06/26/2024, 1:01 AM

## 2024-06-26 NOTE — Progress Notes (Signed)
 Post Partum Day 1 SVD G1P1  IOL for low AFI at term  Girl breast feeding  Subjective: Feels well.  Voiding, ambulating, eating reg diet. Pain is minimal, lochia reduced to minimal.  No breast problem. Tired from baby feeding all night   Objective: Blood pressure 132/82, pulse 93, temperature 98.6 F (37 C), temperature source Oral, resp. rate 18, height 5' (1.524 m), weight 80.3 kg, SpO2 98%, unknown if currently breastfeeding.  Physical Exam:  General: alert, cooperative, and appears stated age 23: appropriate Uterine Fundus: firm Incision: NA DVT Evaluation: No evidence of DVT seen on physical exam.     Latest Ref Rng & Units 06/26/2024    5:20 AM 06/24/2024    6:04 PM 10/29/2022    2:55 AM  CBC  WBC 4.0 - 10.5 K/uL 19.5  13.5  12.0   Hemoglobin 12.0 - 15.0 g/dL 88.3  87.9  85.7   Hematocrit 36.0 - 46.0 % 34.9  36.4  39.8   Platelets 150 - 400 K/uL 422  411  384     A+ Rub Immune  Assessment/Plan: PPD #1 doing well  Routine care  LC to assist   LOS: 1 day   Robbi JONELLE Render, MD 06/26/2024, 8:54 AM

## 2024-06-27 ENCOUNTER — Inpatient Hospital Stay (HOSPITAL_COMMUNITY): Admission: AD | Admit: 2024-06-27 | Source: Home / Self Care | Admitting: Obstetrics & Gynecology

## 2024-06-27 LAB — CBC
HCT: 35.2 % — ABNORMAL LOW (ref 36.0–46.0)
Hemoglobin: 11.5 g/dL — ABNORMAL LOW (ref 12.0–15.0)
MCH: 27.3 pg (ref 26.0–34.0)
MCHC: 32.7 g/dL (ref 30.0–36.0)
MCV: 83.4 fL (ref 80.0–100.0)
Platelets: 468 K/uL — ABNORMAL HIGH (ref 150–400)
RBC: 4.22 MIL/uL (ref 3.87–5.11)
RDW: 14.8 % (ref 11.5–15.5)
WBC: 13.5 K/uL — ABNORMAL HIGH (ref 4.0–10.5)
nRBC: 0 % (ref 0.0–0.2)

## 2024-06-27 LAB — BIRTH TISSUE RECOVERY COLLECTION (PLACENTA DONATION)

## 2024-06-27 MED ORDER — IBUPROFEN 600 MG PO TABS: 600.0000 mg | ORAL_TABLET | Freq: Four times a day (QID) | ORAL | 0 refills | Status: AC

## 2024-06-27 MED ORDER — MEASLES, MUMPS & RUBELLA VAC IJ SOLR
0.5000 mL | Freq: Once | INTRAMUSCULAR | Status: AC
  Administered 2024-06-27: 0.5 mL via SUBCUTANEOUS
  Filled 2024-06-27: qty 0.5

## 2024-06-27 NOTE — Progress Notes (Signed)
 No c/o, pain controlled Nml lochia, voids w/o difficulty Breastfeeding, girl  Patient Vitals for the past 24 hrs:  BP Temp Temp src Pulse Resp SpO2  06/27/24 0532 115/73 98.3 F (36.8 C) Oral 65 17 --  06/26/24 2010 126/81 98.3 F (36.8 C) Oral 81 17 --  06/26/24 1329 116/80 98.4 F (36.9 C) Oral 90 18 99 %   A&ox3 Nml respirations  Abd: soft, nt ,nd; fundus firm and below umb LE: no edema,nt bilat     Latest Ref Rng & Units 06/26/2024    5:20 AM 06/24/2024    6:04 PM 10/29/2022    2:55 AM  CBC  WBC 4.0 - 10.5 K/uL 19.5  13.5  12.0   Hemoglobin 12.0 - 15.0 g/dL 88.3  87.9  85.7   Hematocrit 36.0 - 46.0 % 34.9  36.4  39.8   Platelets 150 - 400 K/uL 422  411  384    A/P; ppd 2 s/p svd Doing well, d/c home Rh pos Rub immune Breastfeeding, girl Plts slightly elevated, repeat cbc now to follow; if increasing then plan outpt f/u

## 2024-06-27 NOTE — Lactation Note (Signed)
 This note was copied from a baby's chart. Lactation Consultation Note  Patient Name: Madeline Freeman Unijb'd Date: 06/27/2024 Age:24 hours Reason for consult: Infant weight loss;Maternal discharge;Follow-up assessment;Term (change in weight loss -2.18 to -3.39% on Day 2 of life.) Per MOB, she feels breastfeeding is going well, infant is breastfeed for 30 minutes most feeding and she is latching in to breast first every feeding before offering formula.  MOB is happy with how breastfeed is going, she initially wanted to pump only but now that infant is breastfeeding well at the breast that is her feeding plan.Per MOB, she only offers formula for some feedings if she feels infant is wanting to eat more after 30 minutes of being latch. MOB has handout  Supplementing with Breastfeeding. Infant had 5 voids and 3 stools since 1 am this morning. MOB does not have any questions or concerns for LC at this time.   Current feeding plan: Day 2 of life 1- MOB will continue to breastfeed infant, by cues every 3 hours or sooner, skin to skin. 2- MOB will continue to supplement infant with formula at her discretion but will continue to latch infant first for every feeding. 3. MOB has community resources after hospital discharge: [facility] hotline, LC breastfeeding support group and [facility] outpatient clinic.   Discharge education: 1- LC discussed engorgement treatment and prevention.  2- LC discussed warning signs of dehydration in infant. 3- How to know if breastfeeding is going well.  Maternal Data    Feeding Mother's Current Feeding Choice: Breast Milk and Formula  LATCH Score Latch: Grasps breast easily, tongue down, lips flanged, rhythmical sucking.  Audible Swallowing: Spontaneous and intermittent  Type of Nipple: Everted at rest and after stimulation  Comfort (Breast/Nipple): Soft / non-tender  Hold (Positioning): Assistance needed to correctly position infant at breast and maintain latch.  LATCH  Score: 9   Lactation Tools Discussed/Used    Interventions Interventions: Skin to skin;Assisted with latch;Support pillows;Breast compression;Education;CDC milk storage guidelines;CDC Guidelines for Breast Pump Cleaning;LC Services brochure;Guidelines for Milk Supply and Pumping Schedule Handout  Discharge Discharge Education: Engorgement and breast care;Warning signs for feeding baby  Consult Status Consult Status: Complete Date: 06/28/24 Follow-up type: Physician    Grayce LULLA Batter 06/27/2024, 1:23 PM

## 2024-06-27 NOTE — Discharge Summary (Signed)
     Postpartum Discharge Summary  Date of Service updated     Patient Name: Madeline Freeman DOB: 10-21-2001 MRN: [id number]  Date of admission: 06/24/2024 Delivery date:06/25/2024 Delivering provider: MODY, VAISHALI Date of discharge: 06/27/2024  Admitting diagnosis: Encounter for induction of labor [Z34.90] SVD (spontaneous vaginal delivery) [O80] Intrauterine pregnancy: [redacted]w[redacted]d     Secondary diagnosis:  Principal Problem:   Postpartum care following vaginal delivery 7/25 Active Problems:   Encounter for induction of labor   SVD (spontaneous vaginal delivery)   Obstetric labial laceration, delivered, current hospitalization   Positive GBS test  Additional problems: none    Discharge diagnosis: Term Pregnancy Delivered                                              Complications: None  Hospital course: Induction of Labor With Vaginal Delivery   23 y.o. yo G2P1011 at [redacted]w[redacted]d was admitted to the hospital 06/24/2024 for induction of labor.  Indication for induction: low amnionic fluid.  Patient had an labor course complicated by nothing Membrane Rupture Time/Date: 12:11 PM,06/25/2024  Delivery Method:Vaginal, Spontaneous Episiotomy: None Lacerations:  Labial Details of delivery can be found in separate delivery note.  Patient had a postpartum course complicated bynothing. Patient is discharged home 06/27/24.  Newborn Data: Birth date:06/25/2024 Birth time:9:04 PM Gender:Female Living status:Living Apgars:8 ,9  Weight:3240 g  Immunizations administered: Immunization History  Administered Date(s) Administered   HPV Quadrivalent 11/06/2015, 11/13/2016   Influenza-Unspecified 11/06/2015   Meningococcal Conjugate 11/06/2015   Tdap 05/24/2011   Varicella 05/24/2011    Physical exam  Vitals:   06/26/24 0850 06/26/24 1329 06/26/24 2010 06/27/24 0532  BP: 122/75 116/80 126/81 115/73  Pulse: 88 90 81 65  Resp: 18 18 17 17   Temp: 98.7 F (37.1 C) 98.4 F (36.9 C) 98.3 F (36.8  C) 98.3 F (36.8 C)  TempSrc: Oral Oral Oral Oral  SpO2: 98% 99%    Weight:      Height:       Labs: Lab Results  Component Value Date   WBC 13.5 (H) 06/27/2024   HGB 11.5 (L) 06/27/2024   HCT 35.2 (L) 06/27/2024   MCV 83.4 06/27/2024   PLT 468 (H) 06/27/2024    Edinburgh Score:    06/27/2024    9:30 AM  Edinburgh Postnatal Depression Scale Screening Tool  I have been able to laugh and see the funny side of things. --      After visit meds:  Allergies as of 06/27/2024       Reactions   Rizatriptan  Nausea Only        Medication List     TAKE these medications    ibuprofen  600 MG tablet Commonly known as: ADVIL  Take 1 tablet (600 mg total) by mouth every 6 (six) hours.   MAGNESIUM PO Take by mouth.   PRENATAL PO Take by mouth.         Discharge home in stable condition Infant Feeding: Breast Infant Disposition:home with mother Discharge instruction: per After Visit Summary and Postpartum booklet. Activity: Advance as tolerated. Pelvic rest for 6 weeks.  Diet: routine diet Anticipated Birth Control: Unsure Postpartum Appointment:6 weeks Additional Postpartum F/U: 1 wk for cbc; h/o depression/anxiety f/u Future Appointments:No future appointments. Follow up Visit:      06/27/2024 Devere FORBES Brave, MD

## 2024-06-27 NOTE — Progress Notes (Addendum)
 CSW received consult for hx of Anxiety and Depression. CSW met with MOB to offer support and complete assessment. When CSW entered room, MOB was observed standing in room. FOB was present sitting in chair holding infant. CSW introduced self and offered privacy. MOB provided verbal consent for CSW to complete consult with FOB present. CSW explained reason for visit. MOB presented as calm, welcomed CSW visit, and remained engaged during encounter. MOB became tearful while discussing baby blues, stating she has noticed herself feeling more emotional since infant's birth. MOB was able to return to a calm baseline and did not display any acute mental health signs/symptoms during assessment.   CSW inquired how MOB is feeling emotionally since giving birth. MOB reports she is feeling good but has not gotten much sleep since delivery. CSW inquired about MOB's mental health history. MOB acknowledged a history of anxiety and depression, stating she was initially diagnosed in 2015. CSW inquired about current treatment. MOB reports she is not current with a therapist or prescribed psychotropic medication. MOB reports she used to be prescribed antidepressants and see a therapist but has not taken mental health medication or met with a therapist since age 18. CSW inquired about mental health symptoms during pregnancy. MOB reports she endorsed a little anxiety, marked by over thinking everything. MOB reports her symptoms were manageable and declined mental health resources. MOB reports she feels comfortable contacting her OBGYN if she is in need of additional support. MOB identified FOB, her mother sister, and FOB's parents as supports. MOB denied current SI/HI.  CSW provided education regarding the baby blues period vs. perinatal mood disorders, discussed treatment and gave resources for mental health follow up if concerns arise.  CSW recommends self-evaluation during the postpartum time period using the New Mom Checklist  from Postpartum Progress and encouraged MOB to contact a medical professional if symptoms are noted at any time.    MOB reports she has all needed items for infant, including a car seat and bassinet. MOB has chosen [facility] [place] for infant's follow up care.  CSW provided review of Sudden Infant Death Syndrome (SIDS) precautions.    CSW identifies no further need for intervention and no barriers to discharge at this time.  Signed,  Sharyne LOIS Roulette, MSW, LCSWA, LCASA 06/27/2024 12:57 PM

## 2024-07-13 ENCOUNTER — Telehealth (HOSPITAL_COMMUNITY): Payer: Self-pay

## 2024-07-13 NOTE — Telephone Encounter (Signed)
 07/13/2024 1834  Name: Madeline Freeman MRN: [id number] DOB: [birth year]  Reason for Call:  Transition of Care Hospital Discharge Call  Contact Status: Patient Contact Status: Message  Language assistant needed:          Follow-Up Questions:    Van Postnatal Depression Scale:  In the Past 7 Days:    PHQ2-9 Depression Scale:     Discharge Follow-up:    Post-discharge interventions: NA  Signature  Rosaline Deretha PEAK

## 2024-08-01 ENCOUNTER — Other Ambulatory Visit: Payer: Self-pay

## 2024-08-01 ENCOUNTER — Ambulatory Visit
Admission: RE | Admit: 2024-08-01 | Discharge: 2024-08-01 | Disposition: A | Discharge status: Home / Self Care | Source: Ambulatory Visit | Attending: Family Medicine | Admitting: Family Medicine

## 2024-08-01 VITALS — BP 109/75 | HR 88 | Temp 98.4°F | Resp 16

## 2024-08-01 DIAGNOSIS — O9123 Nonpurulent mastitis associated with lactation: Secondary | ICD-10-CM

## 2024-08-01 DIAGNOSIS — Z3A49 Greater than 42 weeks gestation of pregnancy: Secondary | ICD-10-CM

## 2024-08-01 MED ORDER — CEFTRIAXONE SODIUM 1 G IJ SOLR
1000.0000 mg | Freq: Once | INTRAMUSCULAR | Status: AC
  Administered 2024-08-01: 1000 mg via INTRAMUSCULAR

## 2024-08-01 MED ORDER — CEPHALEXIN 500 MG PO CAPS: 500.0000 mg | ORAL_CAPSULE | Freq: Four times a day (QID) | ORAL | 0 refills | Status: AC

## 2024-08-01 NOTE — Discharge Instructions (Addendum)
 May use warm or cool compresses, whichever is more soothing Take the antibiotic 4 times a day as directed May take Tylenol  or ibuprofen  as needed for pain Call your doctor if not improving in a couple days

## 2024-08-01 NOTE — ED Triage Notes (Addendum)
 Right breast red, swollen, painful and warm which started yesterday. Subjective fever with chills yesterday. Has had tylenol  and ibuprofen . Breastfeeding currently.

## 2024-08-01 NOTE — ED Provider Notes (Signed)
 [facility]    CSN: [id number] Arrival date & time: 08/01/24  9060      History   Chief Complaint Chief Complaint  Patient presents with   Breast Problem    HPI Madeline Freeman is a 23 y.o. female.   Patient is here for breast pain.  She is breast-feeding her first infant.  She had a daughter on 06/25/2024.  Everything has been going well.  For the last 2 days she has noticed some pain in her right breast.  Yesterday she did have some fever chills and malaise.  Today it is red and firm.  She feels like she has a breast infection.    Past Medical History:  Diagnosis Date   Anxiety    Depression    Migraines     Patient Active Problem List   Diagnosis Date Noted   SVD (spontaneous vaginal delivery) 06/26/2024   Postpartum care following vaginal delivery 7/25 06/26/2024   Obstetric labial laceration, delivered, current hospitalization 06/26/2024   Positive GBS test 06/26/2024   Encounter for induction of labor 06/25/2024    Past Surgical History:  Procedure Laterality Date   CHOLECYSTECTOMY N/A 09/19/2021   Procedure: LAPAROSCOPIC CHOLECYSTECTOMY WITH INTRAOPERATIVE CHOLANGIOGRAM;  Surgeon: Lyndel Deward PARAS, MD;  Location: [facility] OR;  Service: General;  Laterality: N/A;   WISDOM TOOTH EXTRACTION  2018    OB History     Gravida  2   Para  1   Term  1   Preterm  0   AB  1   Living  1      SAB  1   IAB  0   Ectopic  0   Multiple  0   Live Births  1            Home Medications    Prior to Admission medications   Medication Sig Start Date End Date Taking? Authorizing Provider  acetaminophen  (TYLENOL ) 325 MG tablet Take 650 mg by mouth every 6 (six) hours as needed.   Yes [provider]  cephALEXin  (KEFLEX ) 500 MG capsule Take 1 capsule (500 mg total) by mouth 4 (four) times daily. 08/01/24  Yes Maranda Jamee Jacob, MD  ibuprofen  (ADVIL ) 600 MG tablet Take 1 tablet (600 mg total) by mouth every 6 (six) hours. 06/27/24    Almquist, Susan E, MD  MAGNESIUM PO Take by mouth.    [provider]  Prenatal Vit-Fe Fumarate-FA (PRENATAL PO) Take by mouth.    [provider]    Family History Family History  Problem Relation Age of Onset   Migraines Mother    Endometriosis Mother    Bipolar disorder Mother    Anxiety disorder Mother    Depression Mother    Migraines Father    Anxiety disorder Father    Depression Father    Alcohol abuse Father    Depression Sister    Anxiety disorder Sister    Sexual abuse Sister    Migraines Maternal Grandmother    Depression Maternal Grandmother    Breast cancer Maternal Grandmother    Ovarian cancer Maternal Grandmother    Migraines Paternal Grandmother     Social History Social History   Tobacco Use   Smoking status: Never    Passive exposure: Never   Smokeless tobacco: Never  Vaping Use   Vaping status: Former   Devices: not since confirmed pregnancy  Substance Use Topics   Alcohol use: Not Currently    Comment: occ/  social, not since confirmed pregnancy   Drug use: No     Allergies   Rizatriptan    Review of Systems Review of Systems  See HPI Physical Exam Triage Vital Signs ED Triage Vitals  Encounter Vitals Group     BP 08/01/24 0946 109/75     Girls Systolic BP Percentile --      Girls Diastolic BP Percentile --      Boys Systolic BP Percentile --      Boys Diastolic BP Percentile --      Pulse Rate 08/01/24 0946 88     Resp 08/01/24 0946 16     Temp 08/01/24 0946 98.4 F (36.9 C)     Temp src --      SpO2 08/01/24 0946 98 %     Weight --      Height --      Head Circumference --      Peak Flow --      Pain Score 08/01/24 0949 7     Pain Loc --      Pain Education --      Exclude from Growth Chart --    No data found.  Updated Vital Signs BP 109/75   Pulse 88   Temp 98.4 F (36.9 C)   Resp 16   SpO2 98%   Breastfeeding Yes        Physical Exam Constitutional:      General: She is not in acute  distress.    Appearance: She is well-developed. She is ill-appearing.  HENT:     Head: Normocephalic and atraumatic.  Eyes:     Conjunctiva/sclera: Conjunctivae normal.     Pupils: Pupils are equal, round, and reactive to light.  Cardiovascular:     Rate and Rhythm: Normal rate.  Pulmonary:     Effort: Pulmonary effort is normal. No respiratory distress.  Chest:    Abdominal:     General: There is no distension.     Palpations: Abdomen is soft.  Musculoskeletal:        General: Normal range of motion.     Cervical back: Normal range of motion.  Skin:    General: Skin is warm and dry.  Neurological:     Mental Status: She is alert.      UC Treatments / Results  Labs (all labs ordered are listed, but only abnormal results are displayed) Labs Reviewed - No data to display  EKG   Radiology No results found.  Procedures Procedures (including critical care time)  Medications Ordered in UC Medications  cefTRIAXone  (ROCEPHIN ) injection 1,000 mg (1,000 mg Intramuscular Given 08/01/24 1006)    Initial Impression / Assessment and Plan / UC Course  I have reviewed the triage vital signs and the nursing notes.  Pertinent labs & imaging results that were available during my care of the patient were reviewed by me and considered in my medical decision making (see chart for details).     Acute lactation mastitis.  Discussed treatment.  Because she is having malaise fever and chills, I believe a Rocephin  shot is indicated prior to oral antibiotic therapy. Final Clinical Impressions(s) / UC Diagnoses   Final diagnoses:  Mastitis associated with lactation     Discharge Instructions      May use warm or cool compresses, whichever is more soothing Take the antibiotic 4 times a day as directed May take Tylenol  or ibuprofen  as needed for pain Call your doctor if not improving in  a couple days   ED Prescriptions     Medication Sig Dispense Auth. Provider   cephALEXin   (KEFLEX ) 500 MG capsule Take 1 capsule (500 mg total) by mouth 4 (four) times daily. 28 capsule Maranda Jamee Jacob, MD      PDMP not reviewed this encounter.   Maranda Jamee Jacob, MD 08/01/24 [phone]

## 2024-08-02 ENCOUNTER — Telehealth: Payer: Self-pay | Admitting: Emergency Medicine

## 2024-08-02 NOTE — Telephone Encounter (Signed)
 Left message advising doing a follow up call from her visit on yesterday to see how she was doing.  If patient is doing well, can disregard the call.  Any questions or concerns don't hesitate to contact the office.
